# Patient Record
Sex: Male | Born: 1965 | Hispanic: Yes | Marital: Single | Smoking: Former smoker
Health system: Southern US, Community
[De-identification: ages and names within clinical notes are randomized; demographics above are authoritative.]

## PROBLEM LIST (undated history)

## (undated) DIAGNOSIS — Z1211 Encounter for screening for malignant neoplasm of colon: Secondary | ICD-10-CM

## (undated) DIAGNOSIS — T7840XA Allergy, unspecified, initial encounter: Secondary | ICD-10-CM

## (undated) DIAGNOSIS — K219 Gastro-esophageal reflux disease without esophagitis: Secondary | ICD-10-CM

## (undated) HISTORY — DX: Encounter for screening for malignant neoplasm of colon: Z12.11

## (undated) HISTORY — PX: NO PAST SURGERIES: SHX2092

## (undated) HISTORY — DX: Gastro-esophageal reflux disease without esophagitis: K21.9

## (undated) HISTORY — DX: Allergy, unspecified, initial encounter: T78.40XA

---

## 2012-03-04 ENCOUNTER — Ambulatory Visit: Payer: Self-pay | Admitting: Family Medicine

## 2012-03-11 ENCOUNTER — Ambulatory Visit: Payer: Self-pay | Admitting: Unknown Physician Specialty

## 2012-10-17 ENCOUNTER — Ambulatory Visit: Payer: Self-pay | Admitting: Physician Assistant

## 2013-03-28 IMAGING — CT CT ABD-PELV W/ CM
1 of 3 series · 13 of 32 positions shown, 18 images · non-contrast
Comparison: none

REASON FOR EXAM: Epigastric pain Nausea vomiting elevated alkaline
phosphates elevated transa...
COMMENTS:

[Series 2: abd 3mm w 3.0 i40f 3 · axial · 0.73mm/px · z∈[-1132,-724]mm · 13 of 154 slices shown, 18 images]
[im 9/154  soft-tissue]
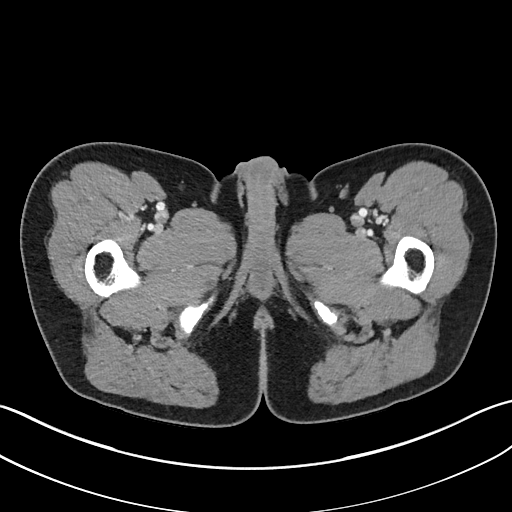
[im 9/154  bone]
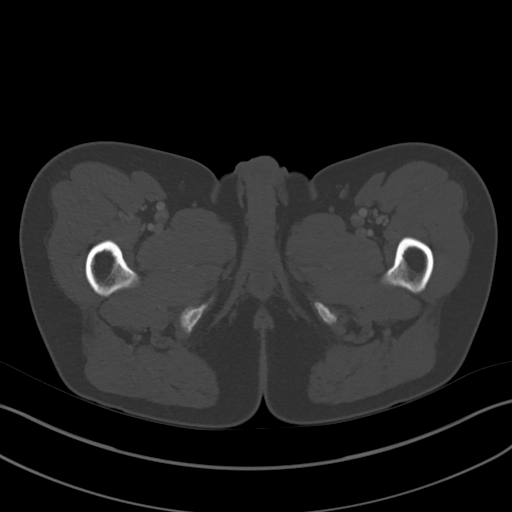
[im 25/154  soft-tissue]
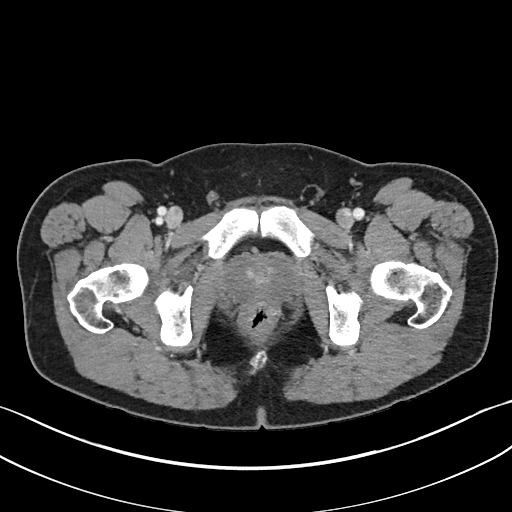
[im 33/154  soft-tissue]
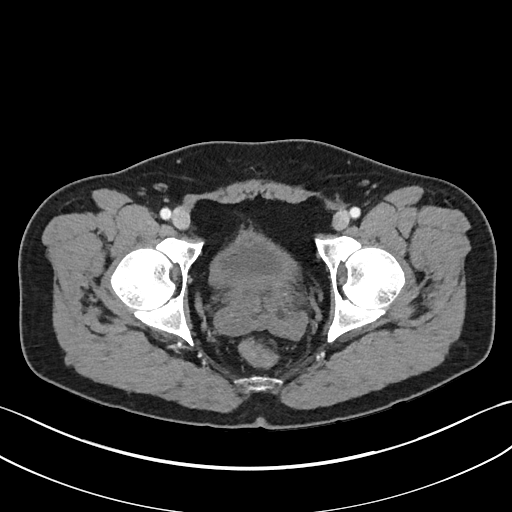
[im 49/154  soft-tissue]
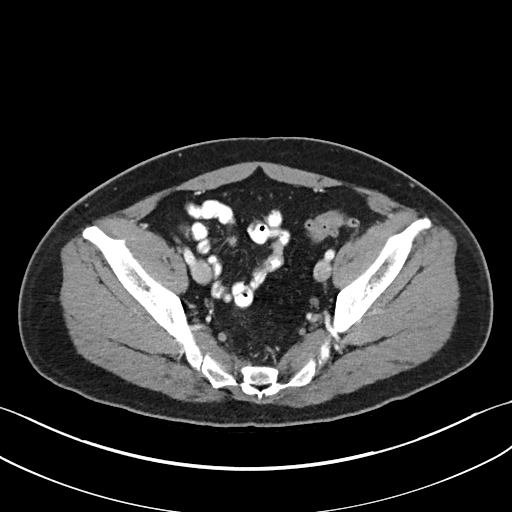
[im 57/154  soft-tissue]
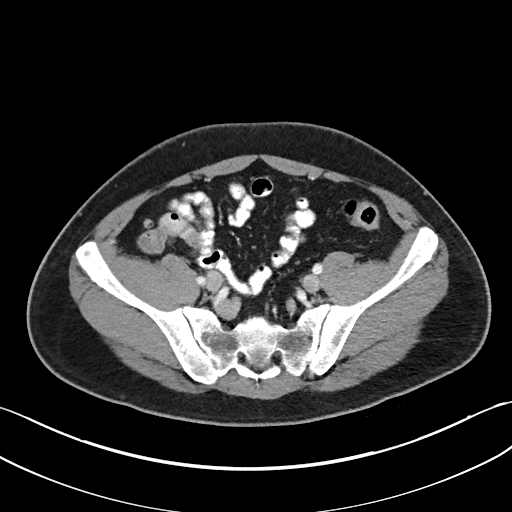
[im 73/154  soft-tissue]
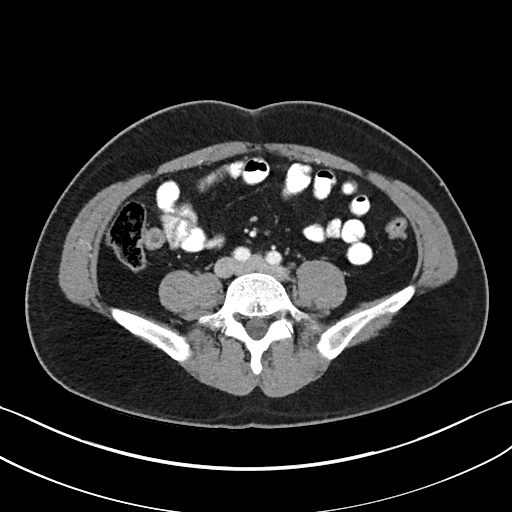
[im 81/154  soft-tissue]
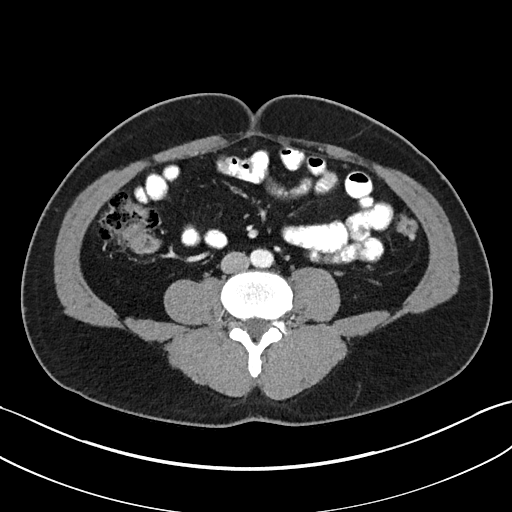
[im 97/154  soft-tissue]
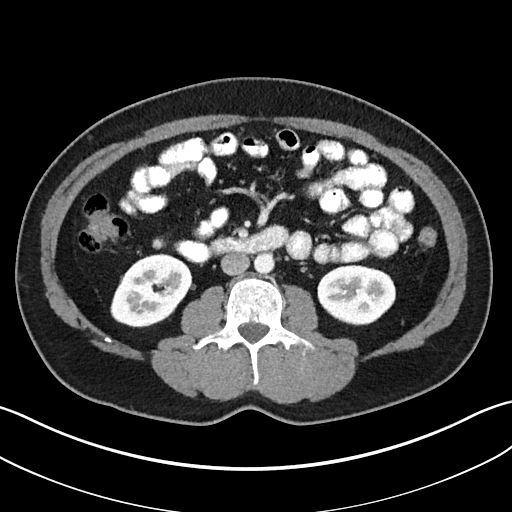
[im 105/154  soft-tissue]
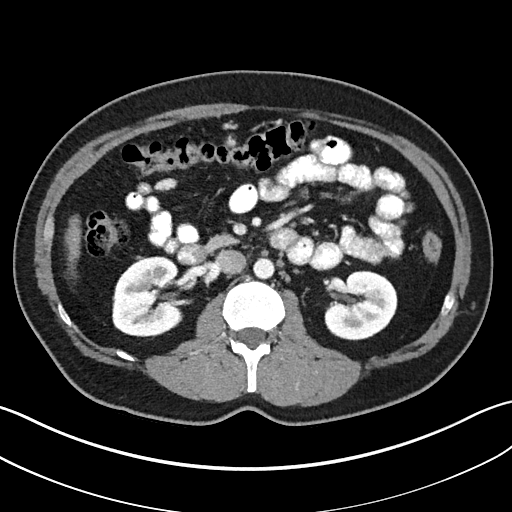
[im 105/154  bone]
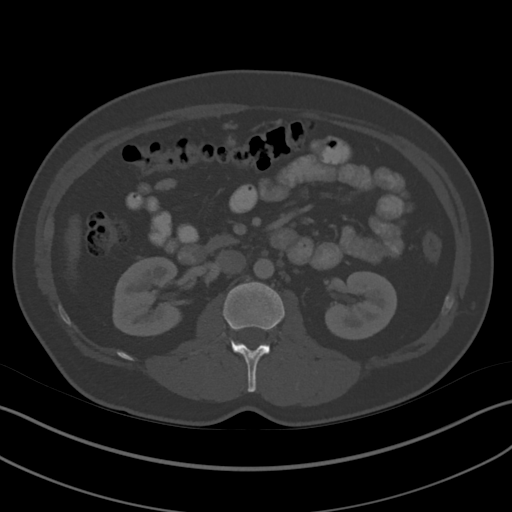
[im 121/154  soft-tissue]
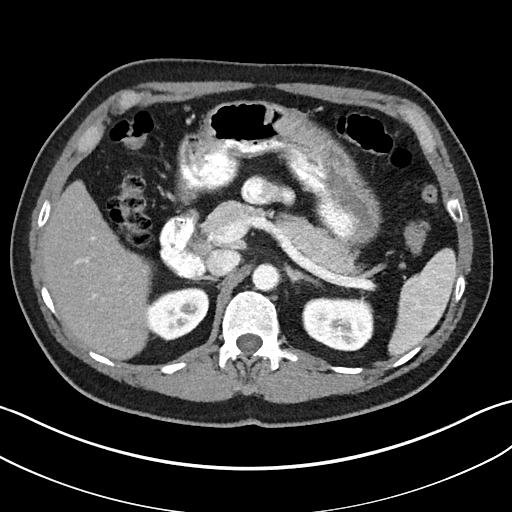
[im 121/154  lung]
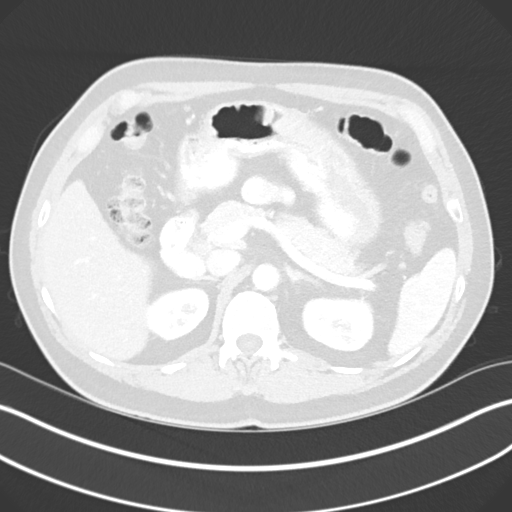
[im 129/154  soft-tissue]
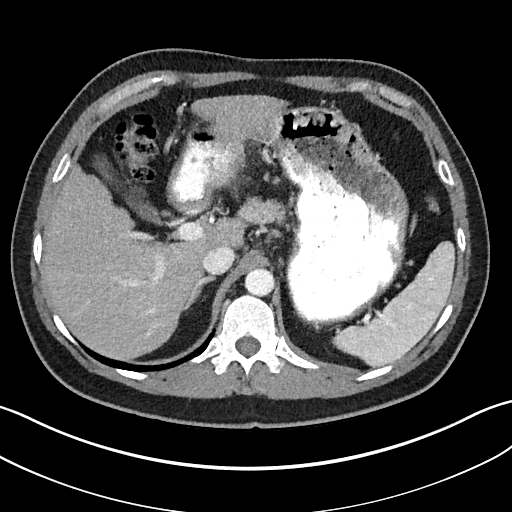
[im 129/154  lung]
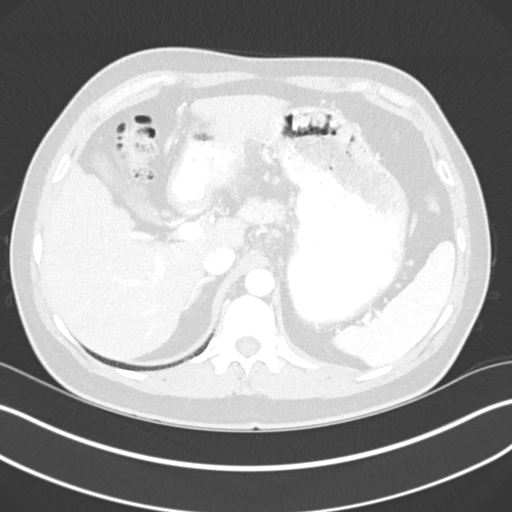
[im 137/154  lung]
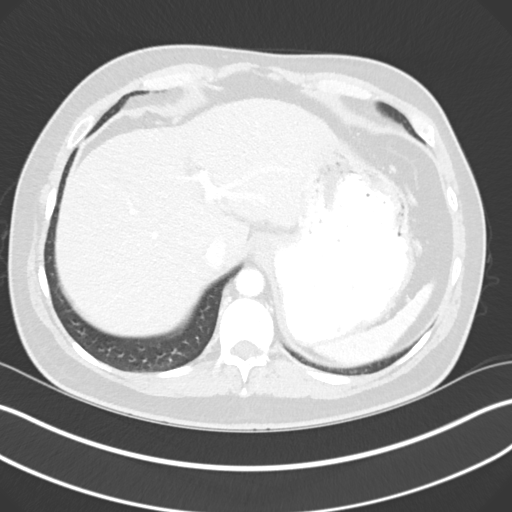
[im 145/154  soft-tissue]
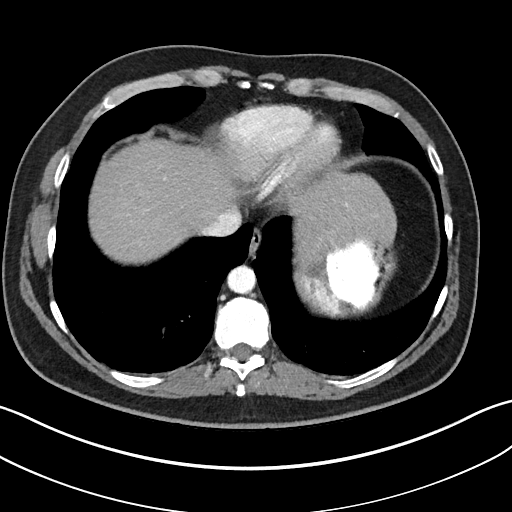
[im 145/154  lung]
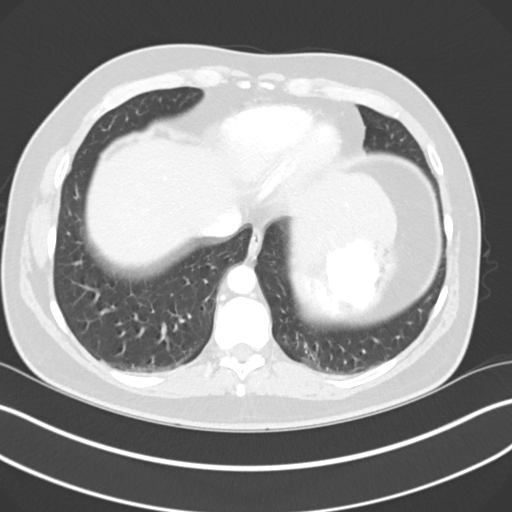

[13 of 32 positions shown; findings below may reference images not displayed]

PROCEDURE:     KCT - KCT ABDOMEN/PELVIS W  - March 11, 2012  [DATE]

RESULT:     Axial CT scanning was performed through the abdomen and pelvis
with reconstructions at 3 mm intervals and slice thicknesses. The patient
received 85 cc of Isovue 370. The patient also received oral contrast
material. Review of multiplanar reconstructed images was performed
separately on the VIA monitor.

The pancreas exhibits normal density with no focal mass or ductal dilation
or inflammatory change. The liver exhibits mildly decreased density
suggesting fatty infiltrative change, but there is no focal mass. There is a
subcentimeter hypodensity in the right lobe near the dome most compatible
with a cyst. There is no intrahepatic ductal dilation. The gallbladder is
adequately distended with no evidence of stones, wall thickening, or
pericholecystic fluid. The spleen is normal in density and size.

There are no adrenal masses. The kidneys enhance well. There is no evidence
of structure nor inflammatory change. The urinary bladder is noncontrast
filled and only partially distended. The prostate gland is enlarged and
produces a prominent impression upon the urinary bladder base. The caliber
of the abdominal aorta is normal. There is no periaortic or pericaval
lymphadenopathy. There is no evidence of ascites. On delayed images contrast
within the renal collecting systems is normal in appearance. There is a
small left inguinal hernia containing only fat.

The stomach is moderately distended with the orally administered contrast.
There is subjective thickening of the wall of the prepyloric region
anteriorly. There does not appear to be obstruction to gastric emptying The
duodenum and jejunum and ileum appear normal. Contrast has not yet reached
the terminal ileum. The terminal ileum appears normal and a normal calibered
partially gas filled appendix is demonstrated. The colon exhibits a normal
stool and gas pattern with no evidence of colitis or of obstruction. There
are scattered diverticula noted.

The lumbar vertebral bodies are preserved in height. The lung bases exhibit
compressive atelectasis.
IMPRESSION: 1. There is no evidence of acute bowel abnormality.
2. There is subjective thickening of the wall of the prepyloric portion of
the stomach. No obstruction to gastric emptying is demonstrated. There is no
perigastric inflammatory change.
3. The duodenum and jejunum and ileum exhibit no acute abnormalities.
4. No gallstones are evident. The liver exhibits no focal mass nor evidence
of ductal dilation. There may be fatty infiltrative change. No acute
pancreatic abnormality is demonstrated.

[REDACTED]

## 2013-11-03 IMAGING — CR RIGHT ELBOW - COMPLETE 3+ VIEW
1 series · 4 of 4 positions shown · non-contrast
Comparison: none

REASON FOR EXAM: injury pain olecraneon process
COMMENTS:

PROCEDURE:     MDR - MDR ELBOW RT COMP W/ OBLIQUES  - October 17, 2012  [DATE]
RESULT:     Right elbow images demonstrate no evidence of fracture,
dislocation or radiopaque foreign body.

[Series 1: lat · 0.17mm/px · 4 of 4 slices shown]
[im 1/4]
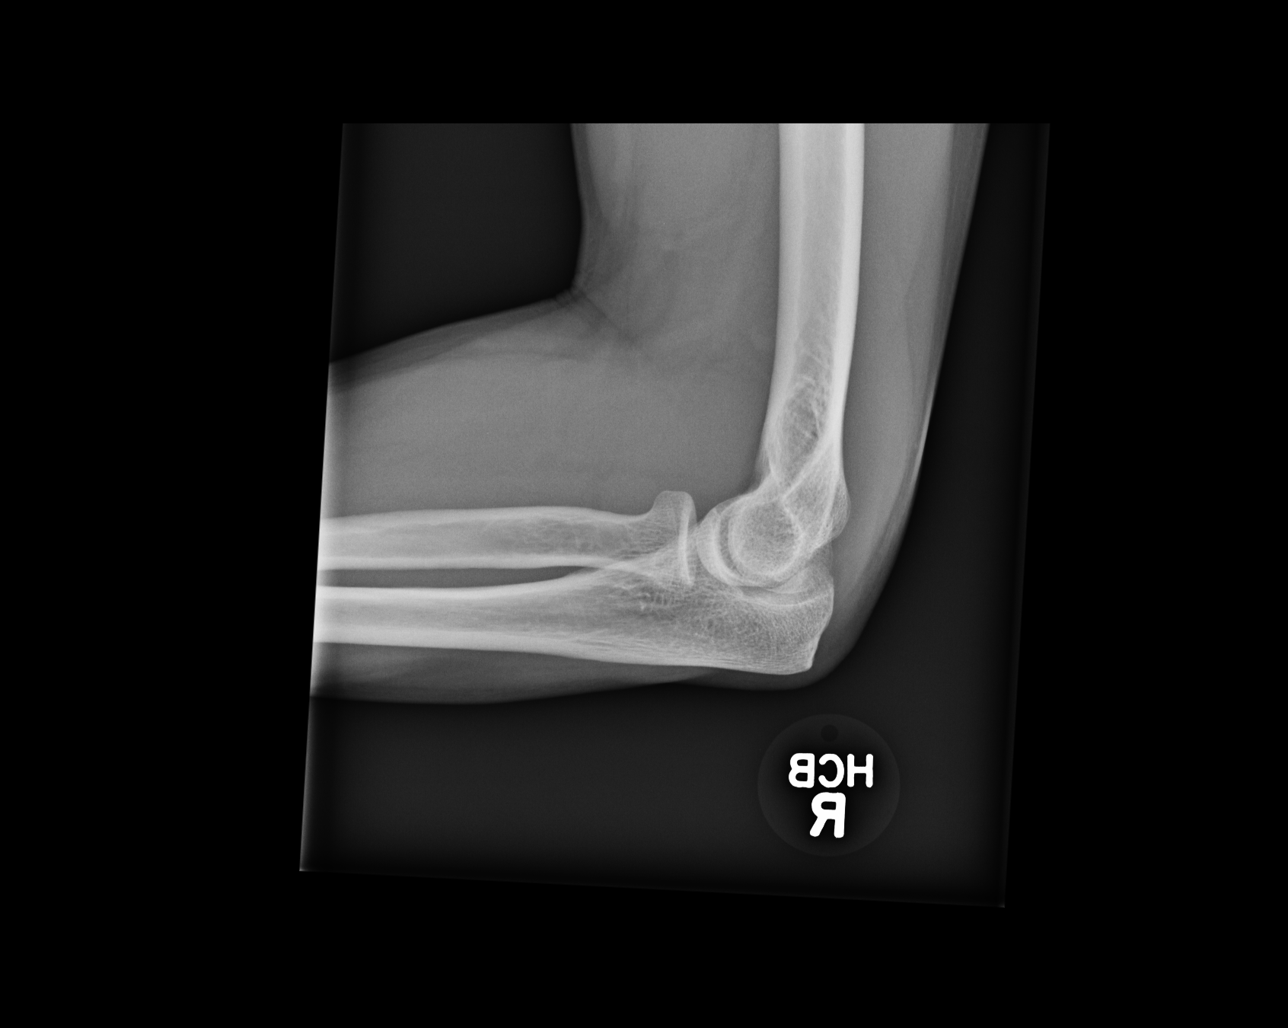
[im 2/4]
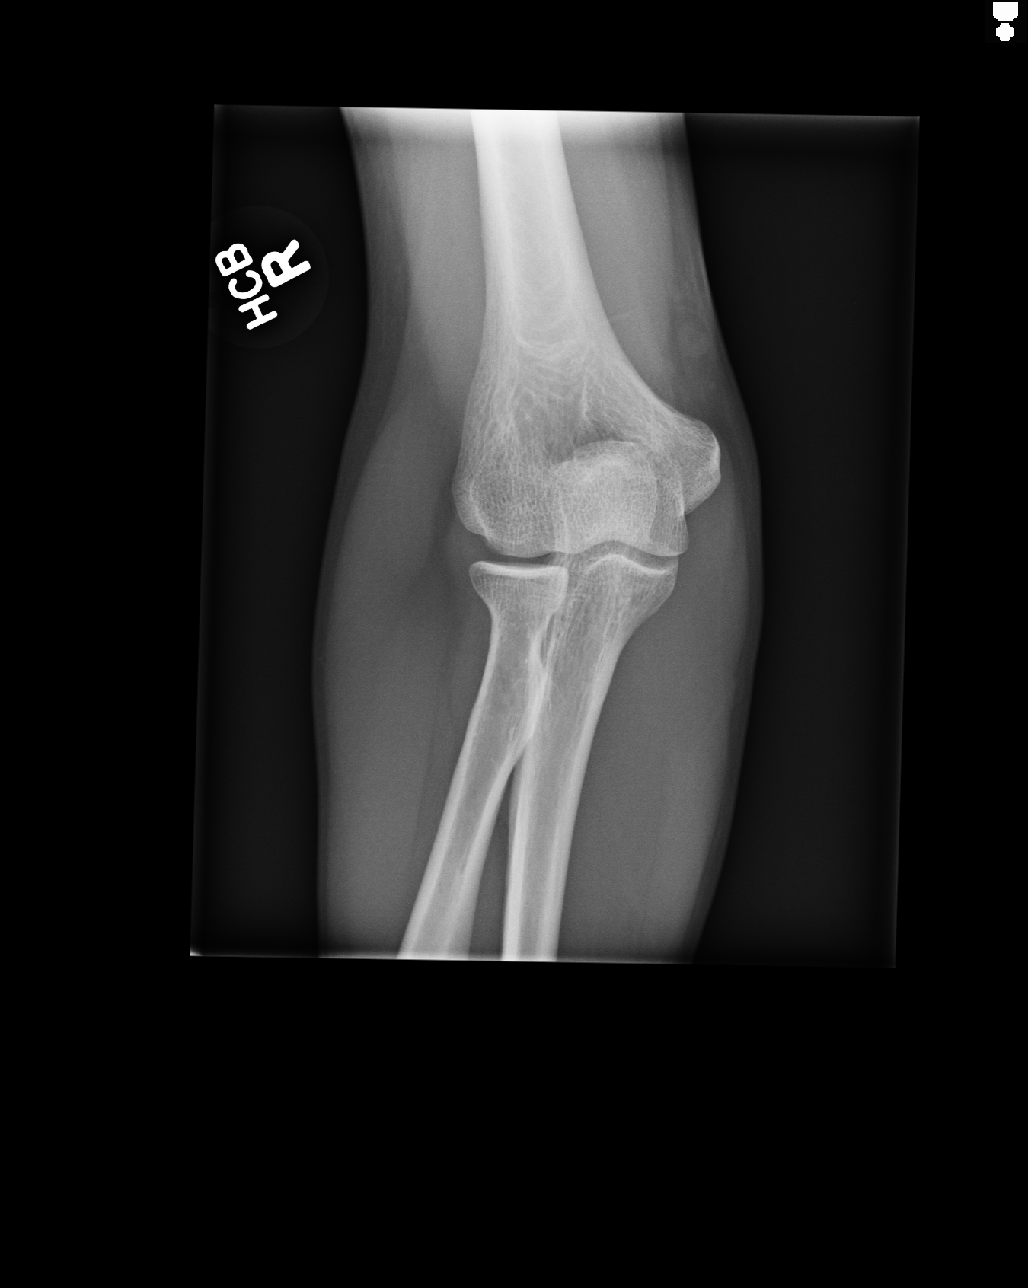
[im 3/4]
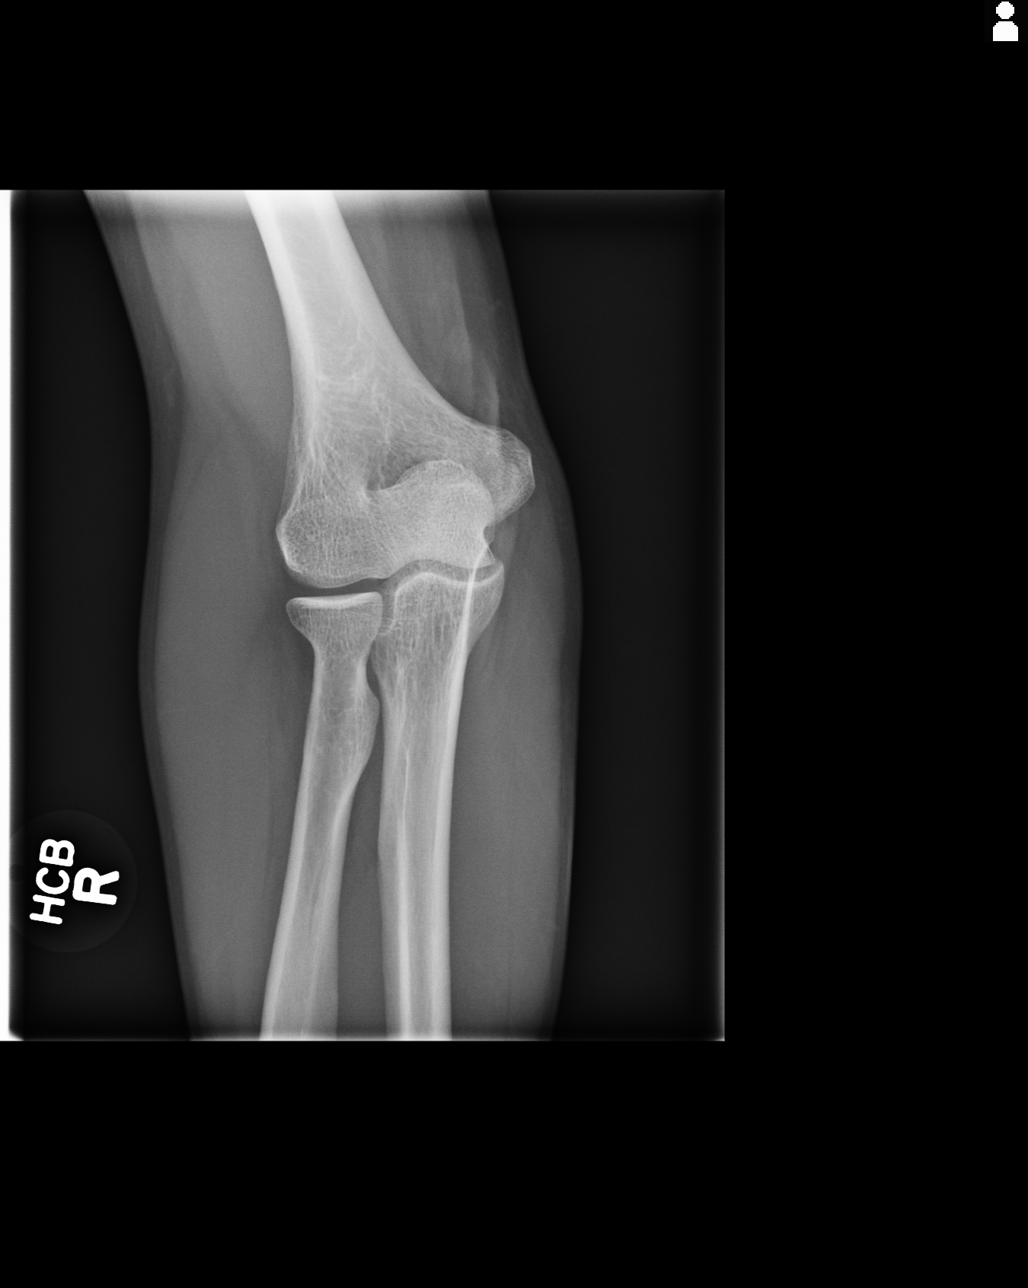
[im 4/4]
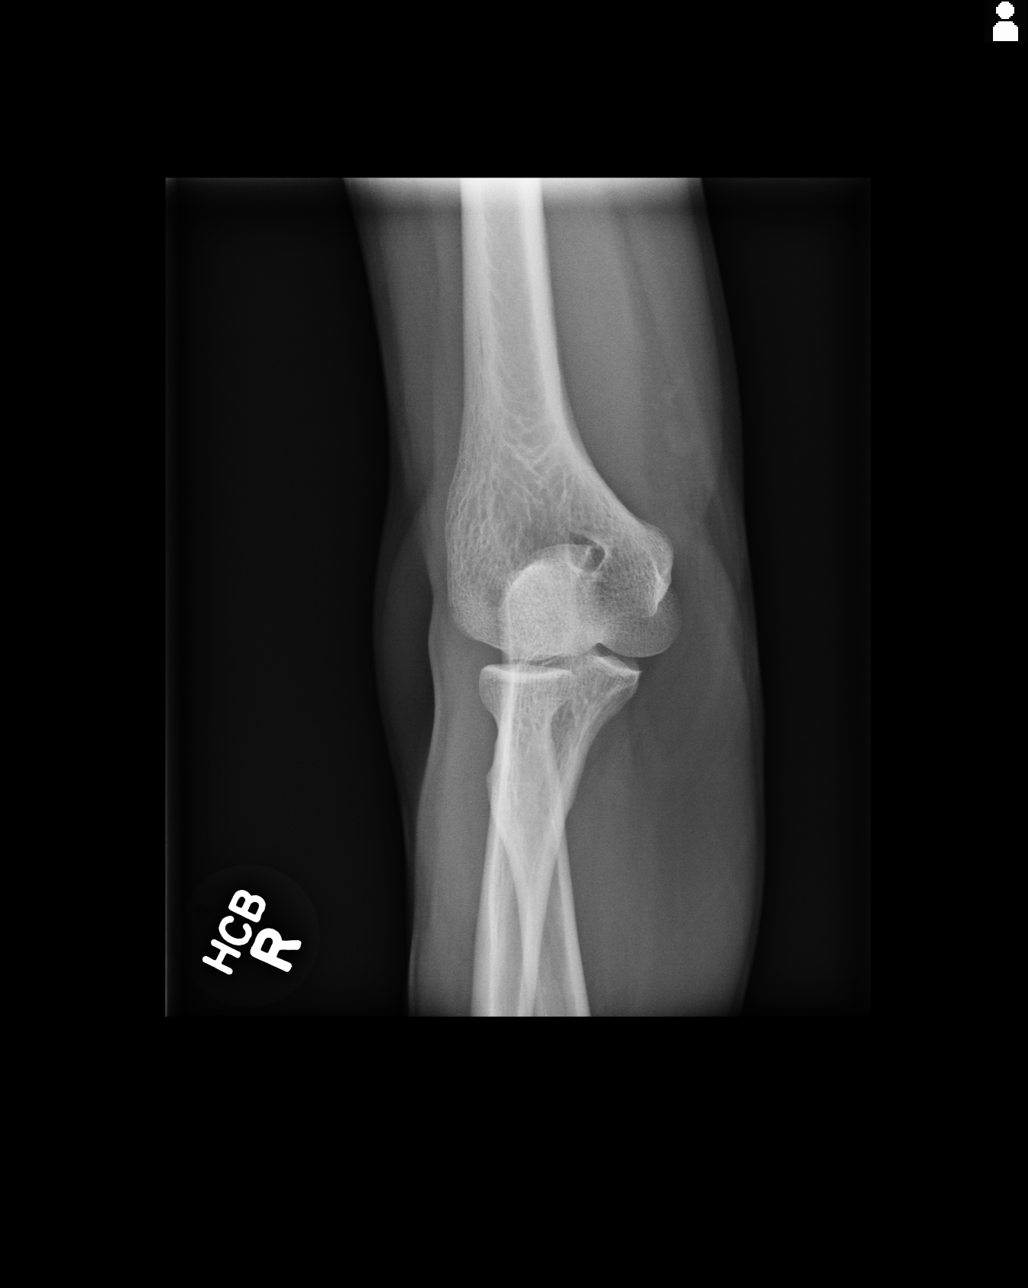

[4 of 4 positions shown; findings below may reference images not displayed]

IMPRESSION: No acute bony abnormality evident in the right elbow.

[REDACTED]

## 2017-05-21 ENCOUNTER — Other Ambulatory Visit: Payer: Self-pay

## 2017-05-25 ENCOUNTER — Ambulatory Visit: Payer: BLUE CROSS/BLUE SHIELD | Admitting: Family Medicine

## 2017-05-25 ENCOUNTER — Encounter: Payer: Self-pay | Admitting: Family Medicine

## 2017-05-25 ENCOUNTER — Other Ambulatory Visit: Payer: Self-pay

## 2017-05-25 VITALS — BP 124/89 | HR 84 | Ht 61.0 in | Wt 151.0 lb

## 2017-05-25 DIAGNOSIS — Z7689 Persons encountering health services in other specified circumstances: Secondary | ICD-10-CM | POA: Diagnosis not present

## 2017-05-25 DIAGNOSIS — K219 Gastro-esophageal reflux disease without esophagitis: Secondary | ICD-10-CM

## 2017-05-25 DIAGNOSIS — R0789 Other chest pain: Secondary | ICD-10-CM

## 2017-05-25 DIAGNOSIS — R945 Abnormal results of liver function studies: Secondary | ICD-10-CM

## 2017-05-25 DIAGNOSIS — L209 Atopic dermatitis, unspecified: Secondary | ICD-10-CM | POA: Insufficient documentation

## 2017-05-25 DIAGNOSIS — R7989 Other specified abnormal findings of blood chemistry: Secondary | ICD-10-CM

## 2017-05-25 MED ORDER — OMEPRAZOLE 20 MG PO CPDR: 20.0000 mg | DELAYED_RELEASE_CAPSULE | Freq: Every day | ORAL | 1 refills | Status: DC

## 2017-05-25 NOTE — Progress Notes (Signed)
Name: Matthew Fletcher   MRN: [id number]    DOB: [birth year]   Date:05/25/2017       Progress Note  Subjective  Chief Complaint  Chief Complaint  Patient presents with  . Establish Care  . Chest Pain    had a workup for heart- everything came back good. Has been told he needs a stress test/ referral to cardiology. Described as a dull ache in chest, "not a killing you pain", tingling in hands    Chest Pain   This is a recurrent problem. The current episode started more than 1 year ago. The onset quality is gradual. The problem occurs intermittently. The problem has been waxing and waning. The pain is present in the lateral region and substernal region (Left pectoral/substernal in morning). The pain is at a severity of 4/10 (duration 1 hour). The quality of the pain is described as pressure. The pain does not radiate. Pertinent negatives include no abdominal pain, back pain, cough, dizziness, fever, headaches, irregular heartbeat, malaise/fatigue, nausea, orthopnea, palpitations, shortness of breath or sputum production.    No problem-specific Assessment & Plan notes found for this encounter.   Past Medical History:  Diagnosis Date  . Allergy   . GERD (gastroesophageal reflux disease)     History reviewed. No pertinent surgical history.  History reviewed. No pertinent family history.  Social History   Socioeconomic History  . Marital status: Single    Spouse name: Not on file  . Number of children: Not on file  . Years of education: Not on file  . Highest education level: Not on file  Occupational History  . Not on file  Social Needs  . Financial resource strain: Not on file  . Food insecurity:    Worry: Not on file    Inability: Not on file  . Transportation needs:    Medical: Not on file    Non-medical: Not on file  Tobacco Use  . Smoking status: Former Research scientist (life sciences)  . Smokeless tobacco: Never Used  Substance and Sexual Activity  . Alcohol use: Yes    Comment: occassionally  .  Drug use: Never  . Sexual activity: Yes  Lifestyle  . Physical activity:    Days per week: Not on file    Minutes per session: Not on file  . Stress: Not on file  Relationships  . Social connections:    Talks on phone: Not on file    Gets together: Not on file    Attends religious service: Not on file    Active member of club or organization: Not on file    Attends meetings of clubs or organizations: Not on file    Relationship status: Not on file  . Intimate partner violence:    Fear of current or ex partner: Not on file    Emotionally abused: Not on file    Physically abused: Not on file    Forced sexual activity: Not on file  Other Topics Concern  . Not on file  Social History Narrative  . Not on file    Allergies  Allergen Reactions  . Tuberculin Tests     Outpatient Medications Prior to Visit  Medication Sig Dispense Refill  . Nutritional Supplements (PROSTATE 2.4) CAPS Take 1 capsule by mouth 2 (two) times daily.    Marland Kitchen omeprazole (PRILOSEC) 20 MG capsule Take 1 capsule by mouth daily.     No facility-administered medications prior to visit.     Review of Systems  Constitutional: Negative  for chills, fever, malaise/fatigue and weight loss.  HENT: Negative for ear discharge, ear pain and sore throat.   Eyes: Negative for blurred vision.  Respiratory: Negative for cough, sputum production, shortness of breath and wheezing.   Cardiovascular: Positive for chest pain. Negative for palpitations, orthopnea and leg swelling.  Gastrointestinal: Negative for abdominal pain, blood in stool, constipation, diarrhea, heartburn, melena and nausea.  Genitourinary: Negative for dysuria, frequency, hematuria and urgency.  Musculoskeletal: Negative for back pain, joint pain, myalgias and neck pain.  Skin: Negative for rash.  Neurological: Negative for dizziness, tingling, sensory change, focal weakness and headaches.  Endo/Heme/Allergies: Negative for environmental allergies and  polydipsia. Does not bruise/bleed easily.  Psychiatric/Behavioral: Negative for depression and suicidal ideas. The patient is not nervous/anxious and does not have insomnia.      Objective  Vitals:   05/25/17 1047  BP: 124/89  Pulse: 84  Weight: 151 lb (68.5 kg)  Height: 5\' 1"  (1.549 m)    Physical Exam  Constitutional: He is oriented to person, place, and time and well-developed, well-nourished, and in no distress.  HENT:  Head: Normocephalic.  Right Ear: External ear normal.  Left Ear: External ear normal.  Nose: Nose normal.  Mouth/Throat: Oropharynx is clear and moist.  Eyes: Pupils are equal, round, and reactive to light. Conjunctivae and EOM are normal. Right eye exhibits no discharge. Left eye exhibits no discharge. No scleral icterus.  Neck: Normal range of motion. Neck supple. No JVD present. No tracheal deviation present. No thyromegaly present.  Cardiovascular: Normal rate, regular rhythm, normal heart sounds and intact distal pulses. Exam reveals no gallop and no friction rub.  No murmur heard. Pulmonary/Chest: Breath sounds normal. No respiratory distress. He has no wheezes. He has no rales.  Abdominal: Soft. Normal aorta and bowel sounds are normal. He exhibits no shifting dullness and no mass. There is no hepatosplenomegaly. There is no tenderness. There is no rebound, no guarding and no CVA tenderness.  Musculoskeletal: Normal range of motion. He exhibits no edema or tenderness.  Lymphadenopathy:    He has no cervical adenopathy.  Neurological: He is alert and oriented to person, place, and time. He has normal sensation, normal strength, normal reflexes and intact cranial nerves. No cranial nerve deficit.  Skin: Skin is warm. No rash noted.  Psychiatric: Mood and affect normal.  Nursing note and vitals reviewed.     Assessment & Plan  Problem List Items Addressed This Visit    None    Visit Diagnoses    Establishing care with new doctor, encounter for     -  Primary   Other chest pain       two 1) substernal pressure in morning and 2) left lateral chest wall pain   Relevant Orders   EKG 12-Lead (Completed)   Ambulatory referral to Cardiology   Elevated liver function tests       Relevant Orders   US Abdomen Limited RUQ   Hepatic Function Panel (6)   Gastroesophageal reflux disease, esophagitis presence not specified       on omeprazole   Relevant Medications   omeprazole (PRILOSEC) 20 MG capsule      No orders of the defined types were placed in this encounter. I spent 50 minutes with this patient, More than 50% of that time was spent in face to face education, counseling and care coordination. Patient was unsure of where he had been seen in the past. We called around to four places trying to  get notes, ekg and labs. We were able to locate and get notes that stated "EKG- wnl, but the actual EKG was not included with notes. Patient also had elevated liver enzymes that we are repeating today.     Dr. [facility]  05/25/17

## 2017-05-26 LAB — HEPATIC FUNCTION PANEL (6)
ALT: 221 IU/L — ABNORMAL HIGH (ref 0–44)
AST: 145 IU/L — ABNORMAL HIGH (ref 0–40)
Albumin: 4.7 g/dL (ref 3.5–5.5)
Alkaline Phosphatase: 139 IU/L — ABNORMAL HIGH (ref 39–117)
Bilirubin Total: 0.5 mg/dL (ref 0.0–1.2)
Bilirubin, Direct: 0.17 mg/dL (ref 0.00–0.40)

## 2017-05-27 ENCOUNTER — Other Ambulatory Visit: Payer: Self-pay

## 2017-05-27 DIAGNOSIS — R748 Abnormal levels of other serum enzymes: Secondary | ICD-10-CM

## 2017-05-31 ENCOUNTER — Ambulatory Visit
Admission: RE | Admit: 2017-05-31 | Discharge: 2017-05-31 | Disposition: A | Discharge status: Home / Self Care | Payer: BLUE CROSS/BLUE SHIELD | Source: Ambulatory Visit | Attending: Family Medicine | Admitting: Family Medicine

## 2017-05-31 DIAGNOSIS — R945 Abnormal results of liver function studies: Secondary | ICD-10-CM | POA: Diagnosis not present

## 2017-05-31 DIAGNOSIS — R7989 Other specified abnormal findings of blood chemistry: Secondary | ICD-10-CM

## 2017-06-03 ENCOUNTER — Other Ambulatory Visit: Payer: Self-pay

## 2017-06-03 DIAGNOSIS — I2089 Other forms of angina pectoris: Secondary | ICD-10-CM | POA: Insufficient documentation

## 2017-06-03 DIAGNOSIS — I208 Other forms of angina pectoris: Secondary | ICD-10-CM | POA: Insufficient documentation

## 2017-06-04 ENCOUNTER — Other Ambulatory Visit
Admission: RE | Admit: 2017-06-04 | Discharge: 2017-06-04 | Disposition: A | Discharge status: Home / Self Care | Payer: BLUE CROSS/BLUE SHIELD | Source: Ambulatory Visit | Attending: Gastroenterology | Admitting: Gastroenterology

## 2017-06-04 ENCOUNTER — Other Ambulatory Visit: Payer: Self-pay

## 2017-06-04 ENCOUNTER — Encounter: Payer: Self-pay | Admitting: Gastroenterology

## 2017-06-04 ENCOUNTER — Ambulatory Visit: Payer: BLUE CROSS/BLUE SHIELD | Admitting: Gastroenterology

## 2017-06-04 VITALS — BP 130/84 | HR 61 | Temp 98.8°F | Ht 61.0 in | Wt 151.0 lb

## 2017-06-04 DIAGNOSIS — Z1211 Encounter for screening for malignant neoplasm of colon: Secondary | ICD-10-CM | POA: Diagnosis not present

## 2017-06-04 DIAGNOSIS — R945 Abnormal results of liver function studies: Secondary | ICD-10-CM | POA: Insufficient documentation

## 2017-06-04 DIAGNOSIS — Z87891 Personal history of nicotine dependence: Secondary | ICD-10-CM | POA: Diagnosis not present

## 2017-06-04 DIAGNOSIS — K219 Gastro-esophageal reflux disease without esophagitis: Secondary | ICD-10-CM | POA: Diagnosis not present

## 2017-06-04 DIAGNOSIS — Z79899 Other long term (current) drug therapy: Secondary | ICD-10-CM | POA: Diagnosis not present

## 2017-06-04 DIAGNOSIS — R7989 Other specified abnormal findings of blood chemistry: Secondary | ICD-10-CM

## 2017-06-04 LAB — BASIC METABOLIC PANEL
Anion gap: 9 (ref 5–15)
BUN: 14 mg/dL (ref 6–20)
CO2: 26 mmol/L (ref 22–32)
Calcium: 9 mg/dL (ref 8.9–10.3)
Chloride: 107 mmol/L (ref 101–111)
Creatinine, Ser: 0.64 mg/dL (ref 0.61–1.24)
GFR calc Af Amer: >60 mL/min (ref >60)
GFR calc non Af Amer: >60 mL/min (ref >60)
Glucose, Bld: 113 mg/dL — ABNORMAL HIGH (ref 65–99)
Potassium: 3.8 mmol/L (ref 3.5–5.1)
Sodium: 142 mmol/L (ref 135–145)

## 2017-06-04 LAB — CBC
HCT: 40.5 % (ref 40.0–52.0)
Hemoglobin: 13.8 g/dL (ref 13.0–18.0)
MCH: 31.4 pg (ref 26.0–34.0)
MCHC: 34 g/dL (ref 32.0–36.0)
MCV: 92.5 fL (ref 80.0–100.0)
Platelets: 196 10*3/uL (ref 150–440)
RBC: 4.39 MIL/uL — ABNORMAL LOW (ref 4.40–5.90)
RDW: 13.7 % (ref 11.5–14.5)
WBC: 5.9 10*3/uL (ref 3.8–10.6)

## 2017-06-04 LAB — PROTIME-INR
INR: 1.08
Prothrombin Time: 13.9 seconds (ref 11.4–15.2)

## 2017-06-04 LAB — TSH: TSH: 1.468 u[IU]/mL (ref 0.350–4.500)

## 2017-06-04 LAB — FERRITIN: Ferritin: 342 ng/mL — ABNORMAL HIGH (ref 24–336)

## 2017-06-04 NOTE — Progress Notes (Signed)
Matthew Darby, MD [address]  [place], [place] [address]  Main: [phone]  Fax: [phone]    Gastroenterology Consultation  Referring Provider:     Juline Patch, MD Primary Care Physician:  Matthew Patch, MD Primary Gastroenterologist:  Dr. Cephas Fletcher Reason for Consultation:     Elevated LFTs        HPI:   Matthew Fletcher is a 52 y.o. male referred by Dr. Juline Patch, MD  for consultation & management of and elevated LFTs. Patient is Spanish-speaking male, from [place] moved to [place] in 1990s. He is incidentally found to have elevated LFTs. His initial abnormal LFTs are documented from 05/18/2017 alkaline phosphatase 124, AST 94, AST 163, T bili 0.6, total protein 7, albumin 4.6 by his primary care provider. He had repeat LFTs which are persistently elevated. Therefore, he is referred to GI for further consultation. Patient tells me that he was found to have abnormal LFTs few years ago when he was seen by [facility] GI. He is not aware of any further workup done at that time. He had an ultrasound which did not reveal evidence of portal hypertension, cirrhosis or portal vein thrombosis. Patient reports frequent liquid Tylenol use from 2010 to 2013 secondary to frequent colds symptoms. When he found that his LFTs were elevated, he stopped taking Tylenol and switched to taking Aleve occasionally. He denies drinking heavily in the past. He does drink occasionally 2 beers per week. He denies IV drug abuse, blood transfusions, was not active in [employer], denies multiple sexual partners, sharing needles He also has chronic heartburn for which he takes omeprazole 20 mg daily before breakfast for the last 4-5 years He otherwise is asymptomatic, denies any other GI or liver related symptoms He takes prostate nutrition supplements, denies any other herbal supplements  NSAIDs: Aleve occasionally  Antiplts/Anticoagulants/Anti thrombotics: none  GI  Procedures: He reports undergoing EGD and a colonoscopy and he was shown 52 years old by [place] [facility] GI. Reports are not available.   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 Allergy   . GERD (gastroesophageal reflux disease)     History reviewed. No pertinent surgical history.   Current Outpatient Medications:  .  Nutritional Supplements (PROSTATE 2.4) CAPS, Take 1 capsule by mouth 2 (two) times daily., Disp: , Rfl:  .  omeprazole (PRILOSEC) 20 MG capsule, Take 1 capsule (20 mg total) by mouth daily., Disp: 30 capsule, Rfl: 1   History reviewed. No pertinent family history.   Social History   Tobacco Use  . Smoking status: Former Research scientist (life sciences)  . Smokeless tobacco: Never Used  Substance Use Topics  . Alcohol use: Yes    Comment: occassionally  . Drug use: Never    Allergies as of 06/04/2017 - Review Complete 06/04/2017  Allergen Reaction Noted  . Tuberculin tests  05/25/2017    Review of Systems:    All systems reviewed and negative except where noted in HPI.   Physical Exam:  BP 130/84   Pulse 61   Temp 98.8 F (37.1 C)   Ht 5\' 1"  (1.549 m)   Wt 151 lb (68.5 kg)   BMI 28.53 kg/m  No LMP for male patient.  General:   Alert,  Well-developed, well-nourished, pleasant and cooperative in NAD Head:  Normocephalic and atraumatic. Eyes:  Sclera clear, no icterus.   Conjunctiva pink. Ears:  Normal auditory acuity. Nose:  No deformity, discharge, or lesions. Mouth:  No deformity or lesions,oropharynx pink & moist. Neck:  Supple; no masses or thyromegaly. Lungs:  Respirations even and unlabored.  Clear throughout to auscultation.   No wheezes, crackles, or rhonchi. No acute distress. Heart:  Regular rate and rhythm; no murmurs, clicks, rubs, or gallops. Abdomen:  Normal bowel sounds. Soft, non-tender and non-distended without  masses, hepatosplenomegaly or hernias noted.  No guarding or rebound tenderness.   Rectal: Not performed Msk:  Symmetrical without gross deformities. Good, equal movement & strength bilaterally. Pulses:  Normal pulses noted. Extremities:  No clubbing or edema.  No cyanosis. Neurologic:  Alert and oriented x3;  grossly normal neurologically. Skin:  Intact without significant lesions or rashes. No jaundice. Lymph Nodes:  No significant cervical adenopathy. Psych:  Alert and cooperative. Normal mood and affect.  Imaging Studies: [place] abd RUQ 05/31/17 IMPRESSION: No abnormality seen in the right upper quadrant of the abdomen.  Assessment and Plan:   Matthew Fletcher is a 52 y.o. [place] male with elevated transaminases of unclear etiology. There is no evidence of chronic liver disease. He does have chronic GERD, currently on omeprazole  Elevated LFTs - Complete secondary liver disease workup, labs ordered - advised him to avoid alcohol use  Chronic GERD Recommend EGD Continue omeprazole  Colon cancer screening Recommend colonoscopy  I have discussed alternative options, risks & benefits,  which include, but are not limited to, bleeding, infection, perforation,respiratory complication & drug reaction.  The patient agrees with this plan & written consent will be obtained.     Follow up in 2 weeks   Matthew Darby, MD

## 2017-06-05 LAB — ANA COMPREHENSIVE PANEL

## 2017-06-05 LAB — HEPATITIS PANEL, ACUTE
HCV Ab: <0.1 s/co ratio (ref 0.0–0.9)
Hep A IgM: NEGATIVE
Hep B C IgM: NEGATIVE
Hepatitis B Surface Ag: NEGATIVE

## 2017-06-05 LAB — CERULOPLASMIN: Ceruloplasmin: 21.4 mg/dL (ref 16.0–31.0)

## 2017-06-05 LAB — HIV ANTIBODY (ROUTINE TESTING W REFLEX): HIV Screen 4th Generation wRfx: NONREACTIVE

## 2017-06-06 LAB — ANTI-SMOOTH MUSCLE ANTIBODY, IGG: F-Actin IgG: 4 Units (ref 0–19)

## 2017-06-06 LAB — MITOCHONDRIAL ANTIBODIES: Mitochondrial M2 Ab, IgG: <20 Units (ref 0.0–20.0)

## 2017-06-07 ENCOUNTER — Other Ambulatory Visit: Payer: Self-pay | Admitting: Gastroenterology

## 2017-06-07 ENCOUNTER — Other Ambulatory Visit: Payer: Self-pay

## 2017-06-07 ENCOUNTER — Encounter: Payer: Self-pay | Admitting: *Deleted

## 2017-06-07 DIAGNOSIS — R7989 Other specified abnormal findings of blood chemistry: Secondary | ICD-10-CM

## 2017-06-07 LAB — ALPHA-1 ANTITRYPSIN PHENOTYPE: A-1 Antitrypsin, Ser: 142 mg/dL (ref 90–200)

## 2017-06-08 ENCOUNTER — Telehealth: Payer: Self-pay

## 2017-06-08 NOTE — Telephone Encounter (Signed)
Patient has been informed that his labs have come back and iron stores are elevate and you will discuss when he comes in for colonoscopy tomorrow.  Thanks  [employer]

## 2017-06-08 NOTE — Telephone Encounter (Signed)
-----   Message from Lin Landsman, MD sent at 06/07/2017  4:00 PM EDT ----- I will talk to him about the results when he comes for the colonoscopy on 06/09/2017  -RV ----- Message ----- From: Buel Ream, Lab In [place] Sent: 06/04/2017  11:54 AM To: Lin Landsman, MD

## 2017-06-09 ENCOUNTER — Ambulatory Visit: Payer: BLUE CROSS/BLUE SHIELD | Admitting: Anesthesiology

## 2017-06-09 ENCOUNTER — Encounter
Admission: RE | Disposition: A | Discharge status: Home / Self Care | Payer: Self-pay | Source: Ambulatory Visit | Attending: Gastroenterology

## 2017-06-09 ENCOUNTER — Other Ambulatory Visit
Admission: RE | Admit: 2017-06-09 | Discharge: 2017-06-09 | Disposition: A | Discharge status: Home / Self Care | Payer: BLUE CROSS/BLUE SHIELD | Source: Ambulatory Visit | Attending: Gastroenterology | Admitting: Gastroenterology

## 2017-06-09 ENCOUNTER — Ambulatory Visit
Admission: RE | Admit: 2017-06-09 | Discharge: 2017-06-09 | Disposition: A | Discharge status: Home / Self Care | Payer: BLUE CROSS/BLUE SHIELD | Source: Ambulatory Visit | Attending: Gastroenterology | Admitting: Gastroenterology

## 2017-06-09 DIAGNOSIS — K219 Gastro-esophageal reflux disease without esophagitis: Secondary | ICD-10-CM

## 2017-06-09 DIAGNOSIS — Z79899 Other long term (current) drug therapy: Secondary | ICD-10-CM | POA: Diagnosis not present

## 2017-06-09 DIAGNOSIS — Z87891 Personal history of nicotine dependence: Secondary | ICD-10-CM | POA: Insufficient documentation

## 2017-06-09 DIAGNOSIS — D123 Benign neoplasm of transverse colon: Secondary | ICD-10-CM | POA: Diagnosis not present

## 2017-06-09 DIAGNOSIS — Q402 Other specified congenital malformations of stomach: Secondary | ICD-10-CM | POA: Diagnosis not present

## 2017-06-09 DIAGNOSIS — R7989 Other specified abnormal findings of blood chemistry: Secondary | ICD-10-CM | POA: Insufficient documentation

## 2017-06-09 DIAGNOSIS — Z1211 Encounter for screening for malignant neoplasm of colon: Secondary | ICD-10-CM

## 2017-06-09 DIAGNOSIS — K2950 Unspecified chronic gastritis without bleeding: Secondary | ICD-10-CM | POA: Diagnosis not present

## 2017-06-09 DIAGNOSIS — K5730 Diverticulosis of large intestine without perforation or abscess without bleeding: Secondary | ICD-10-CM | POA: Insufficient documentation

## 2017-06-09 DIAGNOSIS — K635 Polyp of colon: Secondary | ICD-10-CM | POA: Diagnosis not present

## 2017-06-09 DIAGNOSIS — R12 Heartburn: Secondary | ICD-10-CM | POA: Diagnosis not present

## 2017-06-09 HISTORY — PX: POLYPECTOMY: SHX5525

## 2017-06-09 HISTORY — PX: ESOPHAGOGASTRODUODENOSCOPY (EGD) WITH PROPOFOL: SHX5813

## 2017-06-09 HISTORY — PX: COLONOSCOPY WITH PROPOFOL: SHX5780

## 2017-06-09 SURGERY — COLONOSCOPY WITH PROPOFOL
Anesthesia: General

## 2017-06-09 MED ORDER — SODIUM CHLORIDE 0.9 % IV SOLN
INTRAVENOUS | Status: DC
  Administered 2017-06-09: 11:00:00 via INTRAVENOUS

## 2017-06-09 MED ORDER — OXYCODONE HCL 5 MG/5ML PO SOLN: 5.0000 mg | Freq: Once | ORAL | Status: DC | PRN

## 2017-06-09 MED ORDER — LACTATED RINGERS IV SOLN
10.0000 mL/h | INTRAVENOUS | Status: DC
  Administered 2017-06-09: 10:00:00 via INTRAVENOUS
  Administered 2017-06-09: 10 mL/h via INTRAVENOUS

## 2017-06-09 MED ORDER — GLYCOPYRROLATE 0.2 MG/ML IJ SOLN
INTRAMUSCULAR | Status: DC | PRN
  Administered 2017-06-09: 0.1 mg via INTRAVENOUS

## 2017-06-09 MED ORDER — LIDOCAINE HCL (CARDIAC) 20 MG/ML IV SOLN
INTRAVENOUS | Status: DC | PRN
  Administered 2017-06-09: 20 mg via INTRAVENOUS

## 2017-06-09 MED ORDER — OXYCODONE HCL 5 MG PO TABS: 5.0000 mg | ORAL_TABLET | Freq: Once | ORAL | Status: DC | PRN

## 2017-06-09 MED ORDER — PROPOFOL 10 MG/ML IV BOLUS
INTRAVENOUS | Status: DC | PRN
  Administered 2017-06-09: 80 mg via INTRAVENOUS
  Administered 2017-06-09 (×3): 20 mg via INTRAVENOUS
  Administered 2017-06-09: 50 mg via INTRAVENOUS
  Administered 2017-06-09: 20 mg via INTRAVENOUS
  Administered 2017-06-09: 50 mg via INTRAVENOUS
  Administered 2017-06-09 (×6): 20 mg via INTRAVENOUS
  Administered 2017-06-09: 80 mg via INTRAVENOUS

## 2017-06-09 MED ORDER — STERILE WATER FOR IRRIGATION IR SOLN
Status: DC | PRN
  Administered 2017-06-09: 10:00:00

## 2017-06-09 SURGICAL SUPPLY — 37 items
BALLN DILATOR 10-12 8 (BALLOONS)
BALLN DILATOR 12-15 8 (BALLOONS)
BALLN DILATOR 15-18 8 (BALLOONS)
BALLN DILATOR CRE 0-12 8 (BALLOONS)
BALLN DILATOR ESOPH 8 10 CRE (MISCELLANEOUS) IMPLANT
BALLOON DILATOR 12-15 8 (BALLOONS) IMPLANT
BALLOON DILATOR 15-18 8 (BALLOONS) IMPLANT
BALLOON DILATOR CRE 0-12 8 (BALLOONS) IMPLANT
BLOCK BITE 60FR ADLT L/F GRN (MISCELLANEOUS) ×2 IMPLANT
CANISTER SUCT 1200ML W/VALVE (MISCELLANEOUS) ×2 IMPLANT
CLIP HMST 235XBRD CATH ROT (MISCELLANEOUS) IMPLANT
CLIP RESOLUTION 360 11X235 (MISCELLANEOUS)
ELECT REM PT RETURN 9FT ADLT (ELECTROSURGICAL)
ELECTRODE REM PT RTRN 9FT ADLT (ELECTROSURGICAL) IMPLANT
FCP ESCP3.2XJMB 240X2.8X (MISCELLANEOUS)
FORCEPS BIOP RAD 4 LRG CAP 4 (CUTTING FORCEPS) ×2 IMPLANT
FORCEPS BIOP RJ4 240 W/NDL (MISCELLANEOUS)
FORCEPS ESCP3.2XJMB 240X2.8X (MISCELLANEOUS) IMPLANT
GOWN CVR UNV OPN BCK APRN NK (MISCELLANEOUS) ×2 IMPLANT
GOWN ISOL THUMB LOOP REG UNIV (MISCELLANEOUS) ×2
INJECTOR VARIJECT VIN23 (MISCELLANEOUS) IMPLANT
KIT DEFENDO VALVE AND CONN (KITS) IMPLANT
KIT ENDO PROCEDURE OLY (KITS) ×2 IMPLANT
MARKER SPOT ENDO TATTOO 5ML (MISCELLANEOUS) IMPLANT
PROBE APC STR FIRE (PROBE) IMPLANT
RETRIEVER NET PLAT FOOD (MISCELLANEOUS) IMPLANT
RETRIEVER NET ROTH 2.5X230 LF (MISCELLANEOUS) IMPLANT
SNARE COLD EXACTO (MISCELLANEOUS) IMPLANT
SNARE SHORT THROW 13M SML OVAL (MISCELLANEOUS) IMPLANT
SNARE SHORT THROW 30M LRG OVAL (MISCELLANEOUS) IMPLANT
SNARE SNG USE RND 15MM (INSTRUMENTS) IMPLANT
SPOT EX ENDOSCOPIC TATTOO (MISCELLANEOUS)
SYR INFLATION 60ML (SYRINGE) IMPLANT
TRAP ETRAP POLY (MISCELLANEOUS) IMPLANT
VARIJECT INJECTOR VIN23 (MISCELLANEOUS)
WATER STERILE IRR 250ML POUR (IV SOLUTION) ×2 IMPLANT
WIRE CRE 18-20MM 8CM F G (MISCELLANEOUS) IMPLANT

## 2017-06-09 NOTE — Op Note (Signed)
[facility] Gastroenterology Patient Name: Matthew Fletcher Procedure Date: 06/09/2017 10:00 AM MRN: [id number] Account #: [id number] Date of Birth: Apr 25, 1965 Admit Type: Outpatient Age: 52 Room: [facility] OR ROOM 01 Gender: Male Note Status: Finalized Procedure:            Colonoscopy Indications:          Screening for colorectal malignant neoplasm, This is                        the patient's first colonoscopy Providers:            Lin Landsman MD, MD Medicines:            Monitored Anesthesia Care Complications:        No immediate complications. Estimated blood loss: None. Procedure:            Pre-Anesthesia Assessment:                       - Prior to the procedure, a History and Physical was                        performed, and patient medications and allergies were                        reviewed. The patient is competent. The risks and                        benefits of the procedure and the sedation options and                        risks were discussed with the patient. All questions                        were answered and informed consent was obtained.                        Patient identification and proposed procedure were                        verified by the physician, the nurse, the                        anesthesiologist, the anesthetist and the technician in                        the pre-procedure area in the procedure room in the                        endoscopy suite. Mental Status Examination: alert and                        oriented. Airway Examination: normal oropharyngeal                        airway and neck mobility. Respiratory Examination:                        clear to auscultation. CV Examination: normal.  Prophylactic Antibiotics: The patient does not require                        prophylactic antibiotics. Prior Anticoagulants: The                        patient has taken no previous anticoagulant or                         antiplatelet agents. ASA Grade Assessment: II - A                        patient with mild systemic disease. After reviewing the                        risks and benefits, the patient was deemed in                        satisfactory condition to undergo the procedure. The                        anesthesia plan was to use monitored anesthesia care                        (MAC). Immediately prior to administration of                        medications, the patient was re-assessed for adequacy                        to receive sedatives. The heart rate, respiratory rate,                        oxygen saturations, blood pressure, adequacy of                        pulmonary ventilation, and response to care were                        monitored throughout the procedure. The physical status                        of the patient was re-assessed after the procedure.                       After obtaining informed consent, the colonoscope was                        passed under direct vision. Throughout the procedure,                        the patient's blood pressure, pulse, and oxygen                        saturations were monitored continuously. The Olympus                        CF-HQ190L Colonoscope (S#. [id number]) was introduced  through the anus and advanced to the the terminal                        ileum. The colonoscopy was performed without                        difficulty. The patient tolerated the procedure well.                        The quality of the bowel preparation was evaluated                        using the BBPS [employer] [facility] Bowel Preparation Scale) with                        scores of: Right Colon = 3, Transverse Colon = 3 and                        Left Colon = 3 (entire mucosa seen well with no                        residual staining, small fragments of stool or opaque                        liquid). The total BBPS score equals  9. Findings:      The perianal and digital rectal examinations were normal. Pertinent       negatives include normal sphincter tone and no palpable rectal lesions.      The terminal ileum appeared normal.      A 5 mm polyp was found in the transverse colon. The polyp was sessile.       The polyp was removed with a cold snare. Resection and retrieval were       complete.      A single small-mouthed diverticulum was found in the ascending colon.      The exam was otherwise without abnormality.      The retroflexed view of the distal rectum and anal verge was normal and       showed no anal or rectal abnormalities. Impression:           - The examined portion of the ileum was normal.                       - One 5 mm polyp in the transverse colon, removed with                        a cold snare. Resected and retrieved.                       - Diverticulosis in the ascending colon.                       - The examination was otherwise normal.                       - The distal rectum and anal verge are normal on                        retroflexion view. Recommendation:       -  Discharge patient to home.                       - Resume previous diet today.                       - Continue present medications.                       - Await pathology results.                       - Repeat colonoscopy in 5-10 years for surveillance                        based on pathology results.                       - Return to my office as previously scheduled. Procedure Code(s):    --- Professional ---                       [id number], Colonoscopy, flexible; with removal of tumor(s),                        polyp(s), or other lesion(s) by snare technique Diagnosis Code(s):    --- Professional ---                       Z12.11, Encounter for screening for malignant neoplasm                        of colon                       D12.3, Benign neoplasm of transverse colon (hepatic                        flexure  or splenic flexure)                       K57.30, Diverticulosis of large intestine without                        perforation or abscess without bleeding CPT copyright 2017 American Medical Association. All rights reserved. The codes documented in this report are preliminary and upon coder review may  be revised to meet current compliance requirements. Dr. Ulyess Mort Lin Landsman MD, MD 06/09/2017 10:36:52 AM This report has been signed electronically. Number of Addenda: 0 Note Initiated On: 06/09/2017 10:00 AM Scope Withdrawal Time: 0 hours 6 minutes 18 seconds  Total Procedure Duration: 0 hours 9 minutes 44 seconds       [facility]

## 2017-06-09 NOTE — Anesthesia Postprocedure Evaluation (Signed)
Anesthesia Post Note  Patient: Matthew Fletcher  Procedure(s) Performed: COLONOSCOPY WITH PROPOFOL (N/A ) ESOPHAGOGASTRODUODENOSCOPY (EGD) WITH PROPOFOL (N/A ) POLYPECTOMY (N/A )  Patient location during evaluation: PACU Anesthesia Type: General Level of consciousness: awake and alert Pain management: pain level controlled Vital Signs Assessment: post-procedure vital signs reviewed and stable Respiratory status: spontaneous breathing, nonlabored ventilation, respiratory function stable and patient connected to nasal cannula oxygen Cardiovascular status: blood pressure returned to baseline and stable Postop Assessment: no apparent nausea or vomiting Anesthetic complications: no    Nirali Magouirk

## 2017-06-09 NOTE — Transfer of Care (Signed)
Immediate Anesthesia Transfer of Care Note  Patient: Matthew Fletcher  Procedure(s) Performed: COLONOSCOPY WITH PROPOFOL (N/A ) ESOPHAGOGASTRODUODENOSCOPY (EGD) WITH PROPOFOL (N/A ) POLYPECTOMY (N/A )  Patient Location: PACU  Anesthesia Type: General  Level of Consciousness: awake, alert  and patient cooperative  Airway and Oxygen Therapy: Patient Spontanous Breathing and Patient connected to supplemental oxygen  Post-op Assessment: Post-op Vital signs reviewed, Patient's Cardiovascular Status Stable, Respiratory Function Stable, Patent Airway and No signs of Nausea or vomiting  Post-op Vital Signs: Reviewed and stable  Complications: No apparent anesthesia complications

## 2017-06-09 NOTE — Op Note (Signed)
[facility] Patient Name: Matthew Fletcher Procedure Date: 06/09/2017 10:00 AM MRN: [id number] Account #: [id number] Date of Birth: 1965-10-06 Admit Type: Outpatient Age: 52 Room: [facility] OR ROOM 01 Gender: Male Note Status: Finalized Procedure:            Upper GI endoscopy Indications:          Heartburn Providers:            Lin Landsman MD, MD Referring MD:         Juline Patch, MD (Referring MD) Medicines:            Monitored Anesthesia Care Complications:        No immediate complications. Estimated blood loss: None. Procedure:            Pre-Anesthesia Assessment:                       - Prior to the procedure, a History and Physical was                        performed, and patient medications and allergies were                        reviewed. The patient is competent. The risks and                        benefits of the procedure and the sedation options and                        risks were discussed with the patient. All questions                        were answered and informed consent was obtained.                        Patient identification and proposed procedure were                        verified by the physician, the nurse, the                        anesthesiologist, the anesthetist and the technician in                        the pre-procedure area in the procedure room in the                        endoscopy suite. Mental Status Examination: alert and                        oriented. Airway Examination: normal oropharyngeal                        airway and neck mobility. Respiratory Examination:                        clear to auscultation. CV Examination: normal.                        Prophylactic Antibiotics: The patient does not require  prophylactic antibiotics. Prior Anticoagulants: The                        patient has taken no previous anticoagulant or                        antiplatelet  agents. ASA Grade Assessment: II - A                        patient with mild systemic disease. After reviewing the                        risks and benefits, the patient was deemed in                        satisfactory condition to undergo the procedure. The                        anesthesia plan was to use monitored anesthesia care                        (MAC). Immediately prior to administration of                        medications, the patient was re-assessed for adequacy                        to receive sedatives. The heart rate, respiratory rate,                        oxygen saturations, blood pressure, adequacy of                        pulmonary ventilation, and response to care were                        monitored throughout the procedure. The physical status                        of the patient was re-assessed after the procedure.                       After obtaining informed consent, the endoscope was                        passed under direct vision. Throughout the procedure,                        the patient's blood pressure, pulse, and oxygen                        saturations were monitored continuously. The Olympus                        GIF-HQ190 Endoscope (S#. [id number]) was introduced                        through the mouth, and advanced to the second part of  duodenum. The upper GI endoscopy was accomplished                        without difficulty. The patient tolerated the procedure                        well. Findings:      The duodenal bulb and second portion of the duodenum were normal.      The entire examined stomach was normal. Biopsies were taken with a cold       forceps for Helicobacter pylori testing.      The cardia and gastric fundus were normal on retroflexion.      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      The gastroesophageal junction and examined esophagus were  normal. Impression:           - Normal duodenal bulb and second portion of the                        duodenum.                       - Normal stomach. Biopsied.                       - Ectopic gastric mucosa in the upper third of the                        esophagus.                       - Normal gastroesophageal junction and esophagus. Recommendation:       - Await pathology results. Procedure Code(s):    --- Professional ---                       [phone], Esophagogastroduodenoscopy, flexible, transoral;                        with biopsy, single or multiple Diagnosis Code(s):    --- Professional ---                       Q40.2, Other specified congenital malformations of                        stomach                       R12, Heartburn CPT copyright 2017 American Medical Association. All rights reserved. The codes documented in this report are preliminary and upon coder review may  be revised to meet current compliance requirements. Dr. Ulyess Mort Lin Landsman MD, MD 06/09/2017 10:14:35 AM This report has been signed electronically. Number of Addenda: 0 Note Initiated On: 06/09/2017 10:00 AM Total Procedure Duration: 0 hours 5 minutes 42 seconds       [facility]

## 2017-06-09 NOTE — Anesthesia Preprocedure Evaluation (Addendum)
Anesthesia Evaluation  Patient identified by MRN, date of birth, ID band  Reviewed: NPO status   History of Anesthesia Complications Negative for: history of anesthetic complications  Airway Mallampati: II  TM Distance: >3 FB Neck ROM: full    Dental  (+) Partial Upper   Pulmonary neg pulmonary ROS, former smoker,    Pulmonary exam normal        Cardiovascular Exercise Tolerance: Good + angina (atypical, non cardiac) negative cardio ROS Normal cardiovascular exam  ekg: nsr;  cards stable: 05/2017: dr. Nehemiah Massed; normal stress test without evidence of myocardial ischemia.;     Neuro/Psych negative neurological ROS  negative psych ROS   GI/Hepatic GERD  Controlled,Elevated LFTs   Endo/Other  negative endocrine ROS  Renal/GU negative Renal ROS  negative genitourinary   Musculoskeletal   Abdominal   Peds  Hematology negative hematology ROS (+)   Anesthesia Other Findings   Reproductive/Obstetrics                            Anesthesia Physical Anesthesia Plan  ASA: II  Anesthesia Plan: General   Post-op Pain Management:    Induction:   PONV Risk Score and Plan:   Airway Management Planned:   Additional Equipment:   Intra-op Plan:   Post-operative Plan:   Informed Consent: I have reviewed the patients History and Physical, chart, labs and discussed the procedure including the risks, benefits and alternatives for the proposed anesthesia with the patient or authorized representative who has indicated his/her understanding and acceptance.     Plan Discussed with: CRNA  Anesthesia Plan Comments:         Anesthesia Quick Evaluation

## 2017-06-09 NOTE — H&P (Signed)
Cephas Darby, MD [address]  [place]  [place], [place] [address]  Main: [phone]  Fax: [phone] Pager: [phone]  Primary Care Physician:  Juline Patch, MD Primary Gastroenterologist:  Dr. Cephas Darby  Pre-Procedure History & Physical: HPI:  Matthew Fletcher is a 52 y.o. male is here for an endoscopy and colonoscopy.   Past Medical History:  Diagnosis Date  . Allergy   . GERD (gastroesophageal reflux disease)     Past Surgical History:  Procedure Laterality Date  . NO PAST SURGERIES      Prior to Admission medications   Medication Sig Start Date End Date Taking? Authorizing Provider  Nutritional Supplements (PROSTATE 2.4) CAPS Take 1 capsule by mouth 2 (two) times daily.   Yes [provider]  omeprazole (PRILOSEC) 20 MG capsule Take 1 capsule (20 mg total) by mouth daily. 05/25/17  Yes Juline Patch, MD    Allergies as of 06/04/2017 - Review Complete 06/04/2017  Allergen Reaction Noted  . Tuberculin tests  05/25/2017    History reviewed. No pertinent family history.  Social History   Socioeconomic History  . Marital status: Single    Spouse name: Not on file  . Number of children: Not on file  . Years of education: Not on file  . Highest education level: Not on file  Occupational History  . Not on file  Social Needs  . Financial resource strain: Not on file  . Food insecurity:    Worry: Not on file    Inability: Not on file  . Transportation needs:    Medical: Not on file    Non-medical: Not on file  Tobacco Use  . Smoking status: Former Smoker    Last attempt to quit: 1999    Years since quitting: 20.2  . Smokeless tobacco: Never Used  Substance and Sexual Activity  . Alcohol use: Yes    Comment: occassionally - couple times per year  . Drug use: Never  . Sexual activity: Yes  Lifestyle  . Physical activity:    Days per week: Not on file    Minutes per session: Not on file  . Stress: Not on file  Relationships    . Social connections:    Talks on phone: Not on file    Gets together: Not on file    Attends religious service: Not on file    Active member of club or organization: Not on file    Attends meetings of clubs or organizations: Not on file    Relationship status: Not on file  . Intimate partner violence:    Fear of current or ex partner: Not on file    Emotionally abused: Not on file    Physically abused: Not on file    Forced sexual activity: Not on file  Other Topics Concern  . Not on file  Social History Narrative  . Not on file    Review of Systems: See HPI, otherwise negative ROS  Physical Exam: BP 130/71   Pulse 62   Temp 98.1 F (36.7 C) (Temporal)   Resp 16   Ht 5\' 1"  (1.549 m)   Wt 144 lb (65.3 kg)   SpO2 98%   BMI 27.21 kg/m  General:   Alert,  pleasant and cooperative in NAD Head:  Normocephalic and atraumatic. Neck:  Supple; no masses or thyromegaly. Lungs:  Clear throughout to auscultation.    Heart:  Regular rate and rhythm. Abdomen:  Soft, nontender and  nondistended. Normal bowel sounds, without guarding, and without rebound.   Neurologic:  Alert and  oriented x4;  grossly normal neurologically.  Impression/Plan: Matthew Fletcher is here for an endoscopy and colonoscopy to be performed for Chronic GERD and colon cancer screening  Risks, benefits, limitations, and alternatives regarding  endoscopy and colonoscopy have been reviewed with the patient.  Questions have been answered.  All parties agreeable.   Sherri Sear, MD  06/09/2017, 9:24 AM

## 2017-06-09 NOTE — Anesthesia Procedure Notes (Signed)
Procedure Name: MAC Performed by: Lind Guest, CRNA Pre-anesthesia Checklist: Emergency Drugs available, Suction available, Patient identified, Patient being monitored and Timeout performed Patient Re-evaluated:Patient Re-evaluated prior to induction Oxygen Delivery Method: Nasal cannula

## 2017-06-10 ENCOUNTER — Encounter: Payer: Self-pay | Admitting: Gastroenterology

## 2017-06-11 ENCOUNTER — Encounter: Payer: Self-pay | Admitting: Gastroenterology

## 2017-06-14 LAB — HEMOCHROMATOSIS DNA-PCR(C282Y,H63D)

## 2017-06-16 ENCOUNTER — Telehealth: Payer: Self-pay

## 2017-06-16 DIAGNOSIS — R7989 Other specified abnormal findings of blood chemistry: Secondary | ICD-10-CM

## 2017-06-16 DIAGNOSIS — R945 Abnormal results of liver function studies: Principal | ICD-10-CM

## 2017-06-16 NOTE — Telephone Encounter (Signed)
-----   Message from Lin Landsman, MD sent at 06/14/2017  5:05 PM EDT ----- Sharyn Lull  Please inform patient that he does not have mutation for hereditary hemachromatosis. I recommend him to stop the nutrition supplements (prostate 2.4) that he is on, recheck LFTs. Please order them  Thanks RV

## 2017-06-16 NOTE — Telephone Encounter (Signed)
Patient has been asked to recheck LFT. Stop taking nutrition supplement prostate 2.4 and that he does not have the mutation for hereditary hemachromatosis.  Thanks [employer]

## 2017-06-21 ENCOUNTER — Ambulatory Visit: Payer: BLUE CROSS/BLUE SHIELD | Admitting: Gastroenterology

## 2017-06-21 ENCOUNTER — Encounter: Payer: Self-pay | Admitting: Gastroenterology

## 2017-06-21 VITALS — BP 133/81 | HR 65 | Ht 61.0 in | Wt 150.8 lb

## 2017-06-21 DIAGNOSIS — R945 Abnormal results of liver function studies: Secondary | ICD-10-CM

## 2017-06-21 DIAGNOSIS — R7989 Other specified abnormal findings of blood chemistry: Secondary | ICD-10-CM

## 2017-06-21 NOTE — Progress Notes (Signed)
Cephas Darby, MD [address]  [place], [place] [address]  Main: [phone]  Fax: [phone]    Gastroenterology Consultation  Referring Provider:     Juline Patch, MD Primary Care Physician:  Juline Patch, MD Primary Gastroenterologist:  Dr. Cephas Darby Reason for Consultation:     Elevated LFTs        HPI:   Matthew Fletcher is a 52 y.o. male referred by Dr. Juline Patch, MD  for consultation & management of and elevated LFTs. Patient is Spanish-speaking male, from [place] moved to [place] in 1990s. He is incidentally found to have elevated LFTs. His initial abnormal LFTs are documented from 05/18/2017 alkaline phosphatase 124, AST 94, AST 163, T bili 0.6, total protein 7, albumin 4.6 by his primary care provider. He had repeat LFTs which are persistently elevated. Therefore, he is referred to GI for further consultation. Patient tells me that he was found to have abnormal LFTs few years ago when he was seen by [facility] clinic GI. He is not aware of any further workup done at that time. He had an ultrasound which did not reveal evidence of portal hypertension, cirrhosis or portal vein thrombosis. Patient reports frequent liquid Tylenol use from 2010 to 2013 secondary to frequent colds symptoms. When he found that his LFTs were elevated, he stopped taking Tylenol and switched to taking Aleve occasionally. He denies drinking heavily in the past. He does drink occasionally 2 beers per week. He denies IV drug abuse, blood transfusions, was not active in [employer], denies multiple sexual partners, sharing needles He also has chronic heartburn for which he takes omeprazole 20 mg daily before breakfast for the last 4-5 years He otherwise is asymptomatic, denies any other GI or liver related symptoms He takes prostate nutrition supplements, denies any other herbal supplements  Follow-up visit 06/21/2017: Workup for his elevated LFTs revealed mildly elevated  ferritin. Hereditary hemachromatosis panel came back negative. Ultrasound liver was unremarkable. I have asked him to stop taking prostate supplements which she stopped about 2 weeks ago.He underwent EGD and colonoscopy which were essentially normal. He continues to have heaviness in his left chest whenever he is upset or hearing any bad news. He had cardiac workup and that has been negative. He is on omeprazole 20 mg daily in the morning for heartburn. He wants to know if he could take it long-term or switch to another medication.   NSAIDs: Aleve occasionally  Antiplts/Anticoagulants/Anti thrombotics: none  GI Procedures: He reports undergoing EGD and a colonoscopy when he was shown 52 years old by [facility] GI. Reports are not available.  - Normal duodenal bulb and second portion of the duodenum. - Normal stomach. Biopsied. - Ectopic gastric mucosa in the upper third of the esophagus. - Normal gastroesophageal junction and esophagus.  - The examined portion of the ileum was normal. - One 5 mm polyp in the transverse colon, removed with a cold snare. Resected and retrieved. - Diverticulosis in the ascending colon. - The examination was otherwise normal. - The distal rectum and anal verge are normal on retroflexion view.    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 Allergy   . GERD (gastroesophageal reflux disease)     Past Surgical History:  Procedure Laterality Date  . COLONOSCOPY WITH PROPOFOL N/A 06/09/2017  Procedure: COLONOSCOPY WITH PROPOFOL;  Surgeon: Lin Landsman, MD;  Location: [place];  Service: Endoscopy;  Laterality: N/A;  . ESOPHAGOGASTRODUODENOSCOPY (EGD) WITH PROPOFOL N/A 06/09/2017   Procedure: ESOPHAGOGASTRODUODENOSCOPY (EGD) WITH PROPOFOL;  Surgeon: Lin Landsman, MD;  Location: [place];  Service: Endoscopy;  Laterality: N/A;  . NO PAST SURGERIES    . POLYPECTOMY N/A 06/09/2017   Procedure: POLYPECTOMY;  Surgeon: Lin Landsman, MD;  Location: [place];  Service: Endoscopy;  Laterality: N/A;     Current Outpatient Medications:  .  omeprazole (PRILOSEC) 20 MG capsule, Take 1 capsule (20 mg total) by mouth daily., Disp: 30 capsule, Rfl: 1   History reviewed. No pertinent family history.   Social History   Tobacco Use  . Smoking status: Former Smoker    Last attempt to quit: 1999    Years since quitting: 20.3  . Smokeless tobacco: Never Used  Substance Use Topics  . Alcohol use: Yes    Comment: occassionally - couple times per year  . Drug use: Never    Allergies as of 06/21/2017 - Review Complete 06/21/2017  Allergen Reaction Noted  . Tuberculin tests Rash 05/25/2017    Review of Systems:    All systems reviewed and negative except where noted in HPI.   Physical Exam:  BP 133/81   Pulse 65   Ht 5\' 1"  (1.549 m)   Wt 150 lb 12.8 oz (68.4 kg)   BMI 28.49 kg/m  No LMP for male patient.  General:   Alert,  Well-developed, well-nourished, pleasant and cooperative in NAD Head:  Normocephalic and atraumatic. Eyes:  Sclera clear, no icterus.   Conjunctiva pink. Ears:  Normal auditory acuity. Nose:  No deformity, discharge, or lesions. Mouth:  No deformity or lesions,oropharynx pink & moist. Neck:  Supple; no masses or thyromegaly. Lungs:  Respirations even and unlabored.  Clear throughout to auscultation.   No wheezes, crackles, or rhonchi. No acute distress. Heart:  Regular rate and rhythm; no murmurs, clicks, rubs, or gallops. Abdomen:  Normal bowel sounds. Soft, non-tender and non-distended without masses, hepatosplenomegaly or hernias noted.  No guarding or rebound tenderness.   Rectal: Not performed Msk:  Symmetrical without gross deformities. Good, equal movement & strength bilaterally. Pulses:  Normal pulses noted. Extremities:   No clubbing or edema.  No cyanosis. Neurologic:  Alert and oriented x3;  grossly normal neurologically. Skin:  Intact without significant lesions or rashes. No jaundice. Lymph Nodes:  No significant cervical adenopathy. Psych:  Alert and cooperative. Normal mood and affect.  Imaging Studies: [place] abd RUQ 05/31/17 IMPRESSION: No abnormality seen in the right upper quadrant of the abdomen.  Assessment and Plan:   Afshin Chrystal is a 52 y.o. [place] male with elevated transaminases of unclear etiology. There is no evidence of chronic liver disease. He does have chronic GERD, currently on omeprazole  Elevated LFTs - secondary liver disease workup came back unremarkable - advised him to stop the nutrition supplements - Recheck LFTs in 3-4 weeks, if persistently elevated, recommend liver biopsy  Chronic GERD EGD unremarkable Advised him to switch from omeprazole to H2 blocker daily and see if it helps  Colon cancer screening Colonoscopy was normal. Recommend repeat colonoscopy in 2029   Follow up in 2 months  Cephas Darby, MD

## 2017-07-08 ENCOUNTER — Other Ambulatory Visit: Payer: Self-pay

## 2017-07-08 ENCOUNTER — Other Ambulatory Visit
Admission: RE | Admit: 2017-07-08 | Discharge: 2017-07-08 | Disposition: A | Discharge status: Home / Self Care | Payer: BLUE CROSS/BLUE SHIELD | Source: Ambulatory Visit | Attending: Gastroenterology | Admitting: Gastroenterology

## 2017-07-08 DIAGNOSIS — R945 Abnormal results of liver function studies: Principal | ICD-10-CM

## 2017-07-08 DIAGNOSIS — R7989 Other specified abnormal findings of blood chemistry: Secondary | ICD-10-CM

## 2017-07-08 LAB — HEPATIC FUNCTION PANEL
ALT: 166 U/L — ABNORMAL HIGH (ref 17–63)
AST: 98 U/L — ABNORMAL HIGH (ref 15–41)
Albumin: 4.1 g/dL (ref 3.5–5.0)
Alkaline Phosphatase: 115 U/L (ref 38–126)
Bilirubin, Direct: <0.1 mg/dL — ABNORMAL LOW (ref 0.1–0.5)
Total Bilirubin: 0.6 mg/dL (ref 0.3–1.2)
Total Protein: 6.8 g/dL (ref 6.5–8.1)

## 2017-07-08 NOTE — Progress Notes (Signed)
hepatic 

## 2017-08-23 ENCOUNTER — Encounter: Payer: Self-pay | Admitting: Gastroenterology

## 2017-08-23 ENCOUNTER — Encounter (INDEPENDENT_AMBULATORY_CARE_PROVIDER_SITE_OTHER): Payer: Self-pay

## 2017-08-23 ENCOUNTER — Ambulatory Visit: Payer: BLUE CROSS/BLUE SHIELD | Admitting: Gastroenterology

## 2017-08-23 VITALS — BP 124/80 | HR 71 | Resp 17 | Ht 61.0 in | Wt 151.4 lb

## 2017-08-23 DIAGNOSIS — K219 Gastro-esophageal reflux disease without esophagitis: Secondary | ICD-10-CM

## 2017-08-23 DIAGNOSIS — R945 Abnormal results of liver function studies: Secondary | ICD-10-CM

## 2017-08-23 DIAGNOSIS — R7989 Other specified abnormal findings of blood chemistry: Secondary | ICD-10-CM

## 2017-08-23 NOTE — Progress Notes (Signed)
Cephas Darby, MD [address]  [place]  [place],  [address]  Main: [phone]  Fax: [phone]    Gastroenterology Consultation  Referring Provider:     Juline Patch, MD Primary Care Physician:  Juline Patch, MD Primary Gastroenterologist:  Dr. Cephas Darby Reason for Consultation:     Elevated LFTs        HPI:   Matthew Fletcher is a 52 y.o. male referred by Dr. Juline Patch, MD  for consultation & management of and elevated LFTs. Patient is Spanish-speaking male, from [place] moved to [place] in 1990s. He is incidentally found to have elevated LFTs. His initial abnormal LFTs are documented from 05/18/2017 alkaline phosphatase 124, AST 94, AST 163, T bili 0.6, total protein 7, albumin 4.6 by his primary care provider. He had repeat LFTs which are persistently elevated. Therefore, he is referred to GI for further consultation. Patient tells me that he was found to have abnormal LFTs few years ago when he was seen by [facility] clinic GI. He is not aware of any further workup done at that time. He had an ultrasound which did not reveal evidence of portal hypertension, cirrhosis or portal vein thrombosis. Patient reports frequent liquid Tylenol use from 2010 to 2013 secondary to frequent colds symptoms. When he found that his LFTs were elevated, he stopped taking Tylenol and switched to taking Aleve occasionally. He denies drinking heavily in the past. He does drink occasionally 2 beers per week. He denies IV drug abuse, blood transfusions, was not active in [employer], denies multiple sexual partners, sharing needles He also has chronic heartburn for which he takes omeprazole 20 mg daily before breakfast for the last 4-5 years He otherwise is asymptomatic, denies any other GI or liver related symptoms He takes prostate nutrition supplements, denies any other herbal supplements  Follow-up visit 06/21/2017: Workup for his elevated LFTs revealed mildly elevated  ferritin. Hereditary hemachromatosis panel came back negative. Ultrasound liver was unremarkable. I have asked him to stop taking prostate supplements which she stopped about 2 weeks ago.He underwent EGD and colonoscopy which were essentially normal. He continues to have heaviness in his left chest whenever he is upset or hearing any bad news. He had cardiac workup and that has been negative. He is on omeprazole 20 mg daily in the morning for heartburn. He wants to know if he could take it long-term or switch to another medication.   Follow-up visit 08/23/2017 He is here for follow-up of his LFTs. Since last visit, repeat LFTs are down trending. He denies any complaints today. He stopped the prostate supplements.  NSAIDs: Aleve occasionally  Antiplts/Anticoagulants/Anti thrombotics: none  GI Procedures: He reports undergoing EGD and a colonoscopy when he was shown 52 years old by [facility] clinic GI. Reports are not available.  - Normal duodenal bulb and second portion of the duodenum. - Normal stomach. Biopsied. - Ectopic gastric mucosa in the upper third of the esophagus. - Normal gastroesophageal junction and esophagus.  - The examined portion of the ileum was normal. - One 5 mm polyp in the transverse colon, removed with a cold snare. Resected and retrieved. - Diverticulosis in the ascending colon. - The examination was otherwise normal. - The distal rectum and anal verge are normal on retroflexion view.    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  Allergy   . GERD (gastroesophageal reflux disease)     Past Surgical History:  Procedure Laterality Date  . COLONOSCOPY WITH PROPOFOL N/A 06/09/2017   Procedure: COLONOSCOPY WITH PROPOFOL;  Surgeon: Lin Landsman, MD;  Location: [place];  Service: Endoscopy;  Laterality: N/A;  .  ESOPHAGOGASTRODUODENOSCOPY (EGD) WITH PROPOFOL N/A 06/09/2017   Procedure: ESOPHAGOGASTRODUODENOSCOPY (EGD) WITH PROPOFOL;  Surgeon: Lin Landsman, MD;  Location: [place];  Service: Endoscopy;  Laterality: N/A;  . NO PAST SURGERIES    . POLYPECTOMY N/A 06/09/2017   Procedure: POLYPECTOMY;  Surgeon: Lin Landsman, MD;  Location: [place];  Service: Endoscopy;  Laterality: N/A;     Current Outpatient Medications:  .  omeprazole (PRILOSEC) 20 MG capsule, Take 1 capsule (20 mg total) by mouth daily., Disp: 30 capsule, Rfl: 1   No family history on file.   Social History   Tobacco Use  . Smoking status: Former Smoker    Last attempt to quit: 1999    Years since quitting: 20.4  . Smokeless tobacco: Never Used  Substance Use Topics  . Alcohol use: Yes    Comment: occassionally - couple times per year  . Drug use: Never    Allergies as of 08/23/2017 - Review Complete 08/23/2017  Allergen Reaction Noted  . Tuberculin tests Rash 05/25/2017    Review of Systems:    All systems reviewed and negative except where noted in HPI.   Physical Exam:  BP 124/80 (BP Location: Left Arm, Patient Position: Sitting, Cuff Size: Normal)   Pulse 71   Resp 17   Ht 5\' 1"  (1.549 m)   Wt 151 lb 6.4 oz (68.7 kg)   BMI 28.61 kg/m  No LMP for male patient.  General:   Alert,  Well-developed, well-nourished, pleasant and cooperative in NAD Head:  Normocephalic and atraumatic. Eyes:  Sclera clear, no icterus.   Conjunctiva pink. Ears:  Normal auditory acuity. Nose:  No deformity, discharge, or lesions. Mouth:  No deformity or lesions,oropharynx pink & moist. Neck:  Supple; no masses or thyromegaly. Lungs:  Respirations even and unlabored.  Clear throughout to auscultation.   No wheezes, crackles, or rhonchi. No acute distress. Heart:  Regular rate and rhythm; no murmurs, clicks, rubs, or gallops. Abdomen:  Normal bowel sounds. Soft, non-tender and non-distended  without masses, hepatosplenomegaly or hernias noted.  No guarding or rebound tenderness.   Rectal: Not performed Msk:  Symmetrical without gross deformities. Good, equal movement & strength bilaterally. Pulses:  Normal pulses noted. Extremities:  No clubbing or edema.  No cyanosis. Neurologic:  Alert and oriented x3;  grossly normal neurologically. Skin:  Intact without significant lesions or rashes. No jaundice. Lymph Nodes:  No significant cervical adenopathy. Psych:  Alert and cooperative. Normal mood and affect.  Imaging Studies: [place] abd RUQ 05/31/17 IMPRESSION: No abnormality seen in the right upper quadrant of the abdomen.  Assessment and Plan:   Matthew Fletcher is a 52 y.o. [place] male with elevated transaminases of unclear etiology. There is no evidence of chronic liver disease. He does have chronic GERD, currently on omeprazole  Elevated LFTs - secondary liver disease workup came back unremarkable - continue to stop the nutrition supplements - Recheck LFTs in 3-4 weeks, if persistently elevated, recommend liver biopsy  Chronic GERD EGD unremarkable  Colon cancer screening Colonoscopy was normal. Recommend repeat colonoscopy in 2029   Follow up in 3 months  Cephas Darby, MD

## 2017-08-31 ENCOUNTER — Encounter: Payer: Self-pay | Admitting: *Deleted

## 2017-09-07 ENCOUNTER — Encounter
Admission: RE | Disposition: A | Discharge status: Home / Self Care | Payer: Self-pay | Source: Ambulatory Visit | Attending: Ophthalmology

## 2017-09-07 ENCOUNTER — Ambulatory Visit: Payer: BLUE CROSS/BLUE SHIELD | Admitting: Anesthesiology

## 2017-09-07 ENCOUNTER — Ambulatory Visit
Admission: RE | Admit: 2017-09-07 | Discharge: 2017-09-07 | Disposition: A | Discharge status: Home / Self Care | Payer: BLUE CROSS/BLUE SHIELD | Source: Ambulatory Visit | Attending: Ophthalmology | Admitting: Ophthalmology

## 2017-09-07 DIAGNOSIS — H2511 Age-related nuclear cataract, right eye: Secondary | ICD-10-CM | POA: Insufficient documentation

## 2017-09-07 DIAGNOSIS — Z87891 Personal history of nicotine dependence: Secondary | ICD-10-CM | POA: Diagnosis not present

## 2017-09-07 HISTORY — PX: CATARACT EXTRACTION W/PHACO: SHX586

## 2017-09-07 SURGERY — PHACOEMULSIFICATION, CATARACT, WITH IOL INSERTION
Anesthesia: Monitor Anesthesia Care | Site: Eye | Laterality: Right | Wound class: "Clean "

## 2017-09-07 MED ORDER — MOXIFLOXACIN HCL 0.5 % OP SOLN
OPHTHALMIC | Status: AC
  Filled 2017-09-07: qty 3

## 2017-09-07 MED ORDER — TETRACAINE HCL 0.5 % OP SOLN
1.0000 [drp] | OPHTHALMIC | Status: AC | PRN
  Administered 2017-09-07 (×4): 1 [drp] via OPHTHALMIC

## 2017-09-07 MED ORDER — EPINEPHRINE PF 1 MG/ML IJ SOLN
INTRAOCULAR | Status: DC | PRN
  Administered 2017-09-07: 09:00:00 via OPHTHALMIC

## 2017-09-07 MED ORDER — CYCLOPENTOLATE HCL 2 % OP SOLN
OPHTHALMIC | Status: AC
  Administered 2017-09-07: 1 [drp] via OPHTHALMIC
  Filled 2017-09-07: qty 2

## 2017-09-07 MED ORDER — CYCLOPENTOLATE HCL 2 % OP SOLN
1.0000 [drp] | OPHTHALMIC | Status: AC | PRN
  Administered 2017-09-07 (×4): 1 [drp] via OPHTHALMIC

## 2017-09-07 MED ORDER — GLYCOPYRROLATE 0.2 MG/ML IJ SOLN
INTRAMUSCULAR | Status: DC | PRN
  Administered 2017-09-07: 0.1 mg via INTRAVENOUS

## 2017-09-07 MED ORDER — PHENYLEPHRINE HCL 10 % OP SOLN
OPHTHALMIC | Status: AC
  Administered 2017-09-07: 1 [drp] via OPHTHALMIC
  Filled 2017-09-07: qty 5

## 2017-09-07 MED ORDER — PHENYLEPHRINE HCL 10 % OP SOLN
1.0000 [drp] | OPHTHALMIC | Status: AC | PRN
  Administered 2017-09-07 (×4): 1 [drp] via OPHTHALMIC

## 2017-09-07 MED ORDER — POVIDONE-IODINE 5 % OP SOLN
OPHTHALMIC | Status: AC
  Filled 2017-09-07: qty 30

## 2017-09-07 MED ORDER — EPINEPHRINE PF 1 MG/ML IJ SOLN
INTRAMUSCULAR | Status: AC
  Filled 2017-09-07: qty 2

## 2017-09-07 MED ORDER — GLYCOPYRROLATE 0.2 MG/ML IJ SOLN
INTRAMUSCULAR | Status: AC
  Filled 2017-09-07: qty 1

## 2017-09-07 MED ORDER — NA CHONDROIT SULF-NA HYALURON 40-17 MG/ML IO SOLN
INTRAOCULAR | Status: DC | PRN
  Administered 2017-09-07: 1 mL via INTRAOCULAR

## 2017-09-07 MED ORDER — MOXIFLOXACIN HCL 0.5 % OP SOLN
OPHTHALMIC | Status: DC | PRN
  Administered 2017-09-07: 0.2 mL via OPHTHALMIC

## 2017-09-07 MED ORDER — NA CHONDROIT SULF-NA HYALURON 40-17 MG/ML IO SOLN
INTRAOCULAR | Status: AC
  Filled 2017-09-07: qty 1

## 2017-09-07 MED ORDER — LIDOCAINE HCL (PF) 4 % IJ SOLN
INTRAMUSCULAR | Status: AC
  Filled 2017-09-07: qty 5

## 2017-09-07 MED ORDER — LIDOCAINE HCL (PF) 4 % IJ SOLN
INTRAOCULAR | Status: DC | PRN
  Administered 2017-09-07: 4 mL via OPHTHALMIC

## 2017-09-07 MED ORDER — FENTANYL CITRATE (PF) 100 MCG/2ML IJ SOLN
INTRAMUSCULAR | Status: AC
  Filled 2017-09-07: qty 2

## 2017-09-07 MED ORDER — MIDAZOLAM HCL 2 MG/2ML IJ SOLN
INTRAMUSCULAR | Status: DC | PRN
  Administered 2017-09-07: 2 mg via INTRAVENOUS

## 2017-09-07 MED ORDER — SODIUM CHLORIDE 0.9 % IV SOLN
INTRAVENOUS | Status: DC
  Administered 2017-09-07: 08:00:00 via INTRAVENOUS

## 2017-09-07 MED ORDER — TETRACAINE HCL 0.5 % OP SOLN
OPHTHALMIC | Status: AC
  Administered 2017-09-07: 09:00:00
  Filled 2017-09-07: qty 4

## 2017-09-07 MED ORDER — MOXIFLOXACIN HCL 0.5 % OP SOLN: 1.0000 [drp] | OPHTHALMIC | Status: DC | PRN

## 2017-09-07 MED ORDER — CARBACHOL 0.01 % IO SOLN
INTRAOCULAR | Status: DC | PRN
  Administered 2017-09-07: 0.5 mL via INTRAOCULAR

## 2017-09-07 MED ORDER — MIDAZOLAM HCL 2 MG/2ML IJ SOLN
INTRAMUSCULAR | Status: AC
  Filled 2017-09-07: qty 2

## 2017-09-07 MED ORDER — POVIDONE-IODINE 5 % OP SOLN
OPHTHALMIC | Status: DC | PRN
  Administered 2017-09-07: 1 via OPHTHALMIC

## 2017-09-07 SURGICAL SUPPLY — 16 items
GLOVE BIO SURGEON STRL SZ8 (GLOVE) ×2 IMPLANT
GLOVE BIOGEL M 6.5 STRL (GLOVE) ×2 IMPLANT
GLOVE SURG LX 8.0 MICRO (GLOVE) ×1
GLOVE SURG LX STRL 8.0 MICRO (GLOVE) ×1 IMPLANT
GOWN STRL REUS W/ TWL LRG LVL3 (GOWN DISPOSABLE) ×2 IMPLANT
GOWN STRL REUS W/TWL LRG LVL3 (GOWN DISPOSABLE) ×2
LABEL CATARACT MEDS ST (LABEL) ×2 IMPLANT
LENS IOL TECNIS ITEC 21.5 (Intraocular Lens) ×1 IMPLANT
PACK CATARACT (MISCELLANEOUS) ×2 IMPLANT
PACK CATARACT BRASINGTON LX (MISCELLANEOUS) ×2 IMPLANT
PACK EYE AFTER SURG (MISCELLANEOUS) ×2 IMPLANT
SOL BSS BAG (MISCELLANEOUS) ×2
SOLUTION BSS BAG (MISCELLANEOUS) ×1 IMPLANT
SYR 5ML LL (SYRINGE) ×2 IMPLANT
WATER STERILE IRR 250ML POUR (IV SOLUTION) ×2 IMPLANT
WIPE NON LINTING 3.25X3.25 (MISCELLANEOUS) ×2 IMPLANT

## 2017-09-07 NOTE — OR Nursing (Signed)
Drop schedule explained to pt and family. All voice understanding. discharge instructions discussed with pt and family. All voice understanding. Pt using imprimis drops.

## 2017-09-07 NOTE — Anesthesia Preprocedure Evaluation (Signed)
Anesthesia Evaluation  Patient identified by MRN, date of birth, ID band  Reviewed: NPO status   History of Anesthesia Complications Negative for: history of anesthetic complications  Airway Mallampati: II  TM Distance: >3 FB Neck ROM: full    Dental  (+) Partial Upper   Pulmonary neg pulmonary ROS, former smoker,    Pulmonary exam normal        Cardiovascular Exercise Tolerance: Good + angina (atypical, non cardiac) negative cardio ROS Normal cardiovascular exam  ekg: nsr;  cards stable: 05/2017: dr. Nehemiah Massed; normal stress test without evidence of myocardial ischemia.;     Neuro/Psych negative neurological ROS  negative psych ROS   GI/Hepatic GERD  Controlled,Elevated LFTs   Endo/Other  negative endocrine ROS  Renal/GU negative Renal ROS  negative genitourinary   Musculoskeletal   Abdominal   Peds  Hematology negative hematology ROS (+)   Anesthesia Other Findings   Reproductive/Obstetrics                             Anesthesia Physical  Anesthesia Plan  ASA: II  Anesthesia Plan: MAC   Post-op Pain Management:    Induction: Intravenous  PONV Risk Score and Plan:   Airway Management Planned: Nasal Cannula  Additional Equipment:   Intra-op Plan:   Post-operative Plan:   Informed Consent: I have reviewed the patients History and Physical, chart, labs and discussed the procedure including the risks, benefits and alternatives for the proposed anesthesia with the patient or authorized representative who has indicated his/her understanding and acceptance.     Plan Discussed with: CRNA  Anesthesia Plan Comments:         Anesthesia Quick Evaluation

## 2017-09-07 NOTE — Anesthesia Postprocedure Evaluation (Signed)
Anesthesia Post Note  Patient: Matthew Fletcher  Procedure(s) Performed: CATARACT EXTRACTION PHACO AND INTRAOCULAR LENS PLACEMENT ([place]) (Right Eye)  Patient location during evaluation: Short Stay Anesthesia Type: MAC Level of consciousness: awake and alert, oriented and patient cooperative Pain management: satisfactory to patient Vital Signs Assessment: post-procedure vital signs reviewed and stable Respiratory status: spontaneous breathing and respiratory function stable Cardiovascular status: blood pressure returned to baseline and stable Postop Assessment: no headache, no backache, patient able to bend at knees, no apparent nausea or vomiting, adequate PO intake and able to ambulate Anesthetic complications: no     Last Vitals:  Vitals:   09/07/17 0810 09/07/17 0934  BP: 100/80 107/70  Pulse: (!) 55 (!) 54  Resp: 17 16  Temp: (!) 36.4 C 36.8 C  SpO2: 100% 100%    Last Pain:  Vitals:   09/07/17 0810  TempSrc: Tympanic                 [name]

## 2017-09-07 NOTE — Anesthesia Post-op Follow-up Note (Signed)
Anesthesia QCDR form completed.        

## 2017-09-07 NOTE — Transfer of Care (Signed)
Immediate Anesthesia Transfer of Care Note  Patient: Matthew Fletcher  Procedure(s) Performed: CATARACT EXTRACTION PHACO AND INTRAOCULAR LENS PLACEMENT ([facility]) (Right Eye)  Patient Location: PACU and Short Stay  Anesthesia Type:MAC  Level of Consciousness: awake, alert , oriented and patient cooperative  Airway & Oxygen Therapy: Patient Spontanous Breathing  Post-op Assessment: Report given to RN, Post -op Vital signs reviewed and stable and Patient moving all extremities  Post vital signs: Reviewed and stable  Last Vitals:  Vitals Value Taken Time  BP 107/70 09/07/2017  9:34 AM  Temp 36.8 C 09/07/2017  9:34 AM  Pulse 54 09/07/2017  9:34 AM  Resp 16 09/07/2017  9:34 AM  SpO2 100 % 09/07/2017  9:34 AM    Last Pain:  Vitals:   09/07/17 0810  TempSrc: Tympanic         Complications: No apparent anesthesia complications

## 2017-09-07 NOTE — Op Note (Signed)
PREOPERATIVE DIAGNOSIS:  Nuclear sclerotic cataract of the right eye.   POSTOPERATIVE DIAGNOSIS:  NUCLEAR SCLEROTIC CATARACT RIGHT EYE   OPERATIVE PROCEDURE: Procedure(s): CATARACT EXTRACTION PHACO AND INTRAOCULAR LENS PLACEMENT ([facility])   SURGEON:  Birder Robson, MD.   ANESTHESIA:  Anesthesiologist: Martha Clan, MD CRNA: Nile Riggs, CRNA  1.      Managed anesthesia care. 2.      0.94ml of Shugarcaine was instilled in the eye following the paracentesis.   COMPLICATIONS:  None.   TECHNIQUE:   Stop and chop   DESCRIPTION OF PROCEDURE:  The patient was examined and consented in the preoperative holding area where the aforementioned topical anesthesia was applied to the right eye and then brought back to the Operating Room where the right eye was prepped and draped in the usual sterile ophthalmic fashion and a lid speculum was placed. A paracentesis was created with the side port blade and the anterior chamber was filled with viscoelastic. A near clear corneal incision was performed with the steel keratome. A continuous curvilinear capsulorrhexis was performed with a cystotome followed by the capsulorrhexis forceps. Hydrodissection and hydrodelineation were carried out with BSS on a blunt cannula. The lens was removed in a stop and chop  technique and the remaining cortical material was removed with the irrigation-aspiration handpiece. The capsular bag was inflated with viscoelastic and the Technis [id number]  lens was placed in the capsular bag without complication. The remaining viscoelastic was removed from the eye with the irrigation-aspiration handpiece. The wounds were hydrated. The anterior chamber was flushed with Miostat and the eye was inflated to physiologic pressure. 0.51ml of Vigamox was placed in the anterior chamber. The wounds were found to be water tight. The eye was dressed with Vigamox. The patient was given protective glasses to wear throughout the day and a shield with  which to sleep tonight. The patient was also given drops with which to begin a drop regimen today and will follow-up with me in one day. Implant Name Type Inv. Item Serial No. Manufacturer Lot No. LRB No. Used  LENS IOL DIOP 21.5 - [id number] Intraocular Lens LENS IOL DIOP 21.5 [id number]  Right 1   Procedure(s) with comments: CATARACT EXTRACTION PHACO AND INTRAOCULAR LENS PLACEMENT ([facility]) (Right) - [place] 00:28.5 AP% 15.5 CDE 4.40 Fluid pack lot # [id number]  Electronically signed: Birder Robson 09/07/2017 9:32 AM

## 2017-09-07 NOTE — H&P (Signed)
All labs reviewed. Abnormal studies sent to patients PCP when indicated.  Previous H&P reviewed, patient examined, there are NO CHANGES.  Matthew Manera Porfilio7/9/20199:04 AM

## 2017-09-07 NOTE — Discharge Instructions (Signed)
Eye Surgery Discharge Instructions    Expect mild scratchy sensation or mild soreness. DO NOT RUB YOUR EYE!  The day of surgery:  Minimal physical activity, but bed rest is not required  No reading, computer work, or close hand work  No bending, lifting, or straining.  May watch TV  For 24 hours:  No driving, legal decisions, or alcoholic beverages  Safety precautions  Eat anything you prefer: It is better to start with liquids, then soup then solid foods.  _____ Eye patch should be worn until postoperative exam tomorrow.  ____ Solar shield eyeglasses should be worn for comfort in the sunlight/patch while sleeping  Resume all regular medications including aspirin or Coumadin if these were discontinued prior to surgery. You may shower, bathe, shave, or wash your hair. Tylenol may be taken for mild discomfort.  Call your doctor if you experience significant pain, nausea, or vomiting, fever > 101 or other signs of infection. [phone] or [phone] Specific instructions:  Follow-up Information    Birder Robson, MD Follow up.   Specialty:  Ophthalmology Why:  July 9 at 10:00 am Contact information: [place] [address] [phone]

## 2017-09-14 ENCOUNTER — Other Ambulatory Visit: Payer: Self-pay | Admitting: Family Medicine

## 2017-09-14 DIAGNOSIS — R945 Abnormal results of liver function studies: Secondary | ICD-10-CM

## 2017-09-14 DIAGNOSIS — R7989 Other specified abnormal findings of blood chemistry: Secondary | ICD-10-CM

## 2017-11-24 ENCOUNTER — Ambulatory Visit: Payer: BLUE CROSS/BLUE SHIELD | Admitting: Gastroenterology

## 2017-12-09 ENCOUNTER — Ambulatory Visit: Payer: BLUE CROSS/BLUE SHIELD | Admitting: Gastroenterology

## 2017-12-09 ENCOUNTER — Other Ambulatory Visit: Payer: Self-pay

## 2017-12-09 DIAGNOSIS — R945 Abnormal results of liver function studies: Principal | ICD-10-CM

## 2017-12-09 DIAGNOSIS — R7989 Other specified abnormal findings of blood chemistry: Secondary | ICD-10-CM

## 2017-12-29 ENCOUNTER — Other Ambulatory Visit
Admission: RE | Admit: 2017-12-29 | Discharge: 2017-12-29 | Disposition: A | Discharge status: Home / Self Care | Payer: BLUE CROSS/BLUE SHIELD | Source: Ambulatory Visit | Attending: Gastroenterology | Admitting: Gastroenterology

## 2017-12-29 DIAGNOSIS — R945 Abnormal results of liver function studies: Secondary | ICD-10-CM | POA: Insufficient documentation

## 2017-12-29 DIAGNOSIS — R7989 Other specified abnormal findings of blood chemistry: Secondary | ICD-10-CM

## 2017-12-29 LAB — BASIC METABOLIC PANEL
Anion gap: 10 (ref 5–15)
BUN: 18 mg/dL (ref 6–20)
CO2: 23 mmol/L (ref 22–32)
Calcium: 8.9 mg/dL (ref 8.9–10.3)
Chloride: 105 mmol/L (ref 98–111)
Creatinine, Ser: 0.68 mg/dL (ref 0.61–1.24)
GFR calc Af Amer: >60 mL/min (ref >60)
GFR calc non Af Amer: >60 mL/min (ref >60)
Glucose, Bld: 167 mg/dL — ABNORMAL HIGH (ref 70–99)
Potassium: 4 mmol/L (ref 3.5–5.1)
Sodium: 138 mmol/L (ref 135–145)

## 2017-12-29 LAB — PROTIME-INR
INR: 1.08
Prothrombin Time: 13.9 seconds (ref 11.4–15.2)

## 2017-12-29 LAB — CBC
HCT: 41.1 % (ref 39.0–52.0)
Hemoglobin: 14.1 g/dL (ref 13.0–17.0)
MCH: 32.4 pg (ref 26.0–34.0)
MCHC: 34.3 g/dL (ref 30.0–36.0)
MCV: 94.5 fL (ref 80.0–100.0)
Platelets: 176 10*3/uL (ref 150–400)
RBC: 4.35 MIL/uL (ref 4.22–5.81)
RDW: 12.8 % (ref 11.5–15.5)
WBC: 6.8 10*3/uL (ref 4.0–10.5)
nRBC: 0 % (ref 0.0–0.2)

## 2017-12-29 LAB — HEMOGLOBIN A1C
Hgb A1c MFr Bld: 5.4 % (ref 4.8–5.6)
Mean Plasma Glucose: 108.28 mg/dL

## 2017-12-29 LAB — FERRITIN: Ferritin: 472 ng/mL — ABNORMAL HIGH (ref 24–336)

## 2017-12-29 LAB — TSH: TSH: 2.292 u[IU]/mL (ref 0.350–4.500)

## 2017-12-30 LAB — ALPHA-1-ANTITRYPSIN: A-1 Antitrypsin, Ser: 148 mg/dL (ref 101–187)

## 2017-12-30 LAB — CERULOPLASMIN: Ceruloplasmin: 21.2 mg/dL (ref 16.0–31.0)

## 2017-12-30 LAB — HEPATITIS PANEL, ACUTE
HCV Ab: <0.1 s/co ratio (ref 0.0–0.9)
Hep A IgM: NEGATIVE
Hep B C IgM: NEGATIVE
Hepatitis B Surface Ag: NEGATIVE

## 2017-12-30 LAB — ANA: Anti Nuclear Antibody(ANA): NEGATIVE

## 2017-12-30 LAB — HIV ANTIBODY (ROUTINE TESTING W REFLEX): HIV Screen 4th Generation wRfx: NONREACTIVE

## 2017-12-30 LAB — MITOCHONDRIAL ANTIBODIES: Mitochondrial M2 Ab, IgG: <20 Units (ref 0.0–20.0)

## 2017-12-30 LAB — ANTI-SMOOTH MUSCLE ANTIBODY, IGG: F-Actin IgG: 3 Units (ref 0–19)

## 2018-01-11 ENCOUNTER — Encounter (INDEPENDENT_AMBULATORY_CARE_PROVIDER_SITE_OTHER): Payer: Self-pay

## 2018-01-11 ENCOUNTER — Ambulatory Visit (INDEPENDENT_AMBULATORY_CARE_PROVIDER_SITE_OTHER): Payer: BLUE CROSS/BLUE SHIELD | Admitting: Gastroenterology

## 2018-01-11 ENCOUNTER — Encounter: Payer: Self-pay | Admitting: Gastroenterology

## 2018-01-11 VITALS — BP 133/81 | HR 64 | Resp 16 | Ht 61.0 in | Wt 152.4 lb

## 2018-01-11 DIAGNOSIS — R0789 Other chest pain: Secondary | ICD-10-CM

## 2018-01-11 DIAGNOSIS — K219 Gastro-esophageal reflux disease without esophagitis: Secondary | ICD-10-CM

## 2018-01-11 DIAGNOSIS — R945 Abnormal results of liver function studies: Secondary | ICD-10-CM | POA: Diagnosis not present

## 2018-01-11 DIAGNOSIS — R7989 Other specified abnormal findings of blood chemistry: Secondary | ICD-10-CM

## 2018-01-11 MED ORDER — OMEPRAZOLE 40 MG PO CPDR: 40.0000 mg | DELAYED_RELEASE_CAPSULE | Freq: Every day | ORAL | 0 refills | Status: DC

## 2018-01-11 NOTE — Progress Notes (Signed)
Matthew Darby, MD [address]  [place]  [place],  [address]  Main: [phone]  Fax: [phone]    Gastroenterology Consultation  Referring Provider:     Juline Patch, MD Primary Care Physician:  Juline Patch, MD Primary Gastroenterologist:  Dr. Cephas Fletcher Reason for Consultation:     Elevated LFTs        HPI:   Matthew Fletcher is a 52 y.o. male referred by Dr. Juline Patch, MD  for consultation & management of and elevated LFTs. Patient is Spanish-speaking male, from [place] moved to [place] in 1990s. He is incidentally found to have elevated LFTs. His initial abnormal LFTs are documented from 05/18/2017 alkaline phosphatase 124, AST 94, AST 163, T bili 0.6, total protein 7, albumin 4.6 by his primary care provider. He had repeat LFTs which are persistently elevated. Therefore, he is referred to GI for further consultation. Patient tells me that he was found to have abnormal LFTs few years ago when he was seen by [facility] clinic GI. He is not aware of any further workup done at that time. He had an ultrasound which did not reveal evidence of portal hypertension, cirrhosis or portal vein thrombosis. Patient reports frequent liquid Tylenol use from 2010 to 2013 secondary to frequent colds symptoms. When he found that his LFTs were elevated, he stopped taking Tylenol and switched to taking Aleve occasionally. He denies drinking heavily in the past. He does drink occasionally 2 beers per week. He denies IV drug abuse, blood transfusions, was not active in [employer], denies multiple sexual partners, sharing needles He also has chronic heartburn for which he takes omeprazole 20 mg daily before breakfast for the last 4-5 years He otherwise is asymptomatic, denies any other GI or liver related symptoms He takes prostate nutrition supplements, denies any other herbal supplements  Follow-up visit 06/21/2017: Workup for his elevated LFTs revealed mildly elevated  ferritin. Hereditary hemachromatosis panel came back negative. Ultrasound liver was unremarkable. I have asked him to stop taking prostate supplements which she stopped about 2 weeks ago.He underwent EGD and colonoscopy which were essentially normal. He continues to have heaviness in his left chest whenever he is upset or hearing any bad news. He had cardiac workup and that has been negative. He is on omeprazole 20 mg daily in the morning for heartburn. He wants to know if he could take it long-term or switch to another medication.   Follow-up visit 08/23/2017 He is here for follow-up of his LFTs. Since last visit, repeat LFTs are down trending. He denies any complaints today. He stopped the prostate supplements.  Follow-up visit 01/11/2018 Patient recently underwent repeat work-up for secondary liver disease which was unremarkable.  However, LFTs were not checked.  He continues to have atypical chest pain and heartburn on omeprazole 20 mg daily.  He continues to have dull morning headaches.  NSAIDs: Aleve occasionally  Antiplts/Anticoagulants/Anti thrombotics: none  GI Procedures: He reports undergoing EGD and a colonoscopy when he was shown 52 years old by [facility] clinic GI. Reports are not available.  - Normal duodenal bulb and second portion of the duodenum. - Normal stomach. Biopsied. - Ectopic gastric mucosa in the upper third of the esophagus. - Normal gastroesophageal junction and esophagus.  - The examined portion of the ileum was normal. - One 5 mm polyp in the transverse colon, removed with a cold snare. Resected and retrieved. - Diverticulosis in the ascending colon. - The examination was otherwise  normal. - The distal rectum and anal verge are normal on retroflexion view.    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 Allergy   . GERD  (gastroesophageal reflux disease)     Past Surgical History:  Procedure Laterality Date  . CATARACT EXTRACTION W/PHACO Right 09/07/2017   Procedure: CATARACT EXTRACTION PHACO AND INTRAOCULAR LENS PLACEMENT ([facility]);  Surgeon: Birder Robson, MD;  Location: [facility];  Service: Ophthalmology;  Laterality: Right;  [place] 00:28.5 AP% 15.5 CDE 4.40 Fluid pack lot # [id number]  . COLONOSCOPY WITH PROPOFOL N/A 06/09/2017   Procedure: COLONOSCOPY WITH PROPOFOL;  Surgeon: Lin Landsman, MD;  Location: [place];  Service: Endoscopy;  Laterality: N/A;  . ESOPHAGOGASTRODUODENOSCOPY (EGD) WITH PROPOFOL N/A 06/09/2017   Procedure: ESOPHAGOGASTRODUODENOSCOPY (EGD) WITH PROPOFOL;  Surgeon: Lin Landsman, MD;  Location: [place];  Service: Endoscopy;  Laterality: N/A;  . NO PAST SURGERIES    . POLYPECTOMY N/A 06/09/2017   Procedure: POLYPECTOMY;  Surgeon: Lin Landsman, MD;  Location: [place];  Service: Endoscopy;  Laterality: N/A;     Current Outpatient Medications:  .  omeprazole (PRILOSEC) 40 MG capsule, Take 1 capsule (40 mg total) by mouth daily., Disp: 90 capsule, Rfl: 0   No family history on file.   Social History   Tobacco Use  . Smoking status: Former Smoker    Last attempt to quit: 1999    Years since quitting: 20.8  . Smokeless tobacco: Never Used  Substance Use Topics  . Alcohol use: Yes    Comment: occassionally - couple times per year  . Drug use: Never    Allergies as of 01/11/2018 - Review Complete 01/11/2018  Allergen Reaction Noted  . Tuberculin tests Rash 05/25/2017    Review of Systems:    All systems reviewed and negative except where noted in HPI.   Physical Exam:  BP 133/81 (BP Location: Left Arm, Patient Position: Sitting, Cuff Size: Normal)   Pulse 64   Resp 16   Ht 5\' 1"  (1.549 m)   Wt 152 lb 6.4 oz (69.1 kg)   BMI 28.80 kg/m  No LMP for male patient.  General:   Alert,  Well-developed, well-nourished,  pleasant and cooperative in NAD Head:  Normocephalic and atraumatic. Eyes:  Sclera clear, no icterus.   Conjunctiva pink. Ears:  Normal auditory acuity. Nose:  No deformity, discharge, or lesions. Mouth:  No deformity or lesions,oropharynx pink & moist. Neck:  Supple; no masses or thyromegaly. Lungs:  Respirations even and unlabored.  Clear throughout to auscultation.   No wheezes, crackles, or rhonchi. No acute distress. Heart:  Regular rate and rhythm; no murmurs, clicks, rubs, or gallops. Abdomen:  Normal bowel sounds. Soft, non-tender and non-distended without masses, hepatosplenomegaly or hernias noted.  No guarding or rebound tenderness.   Rectal: Not performed Msk:  Symmetrical without gross deformities. Good, equal movement & strength bilaterally. Pulses:  Normal pulses noted. Extremities:  No clubbing or edema.  No cyanosis. Neurologic:  Alert and oriented x3;  grossly normal neurologically. Skin:  Intact without significant lesions or rashes. No jaundice. Lymph Nodes:  No significant cervical adenopathy. Psych:  Alert and cooperative. Normal mood and affect.  Imaging Studies: [place] abd RUQ 05/31/17 IMPRESSION: No abnormality seen in the right upper quadrant of the abdomen.  Assessment and Plan:   Matthew Fletcher is  a 52 y.o. [place] male with elevated transaminases of unclear etiology. There is no evidence of chronic liver disease. He does have chronic GERD, currently on omeprazole  Elevated LFTs - secondary liver disease workup came back unremarkable - continue to stop the nutrition supplements - Recheck LFTs today, if persistently elevated, recommend liver biopsy  Chronic GERD EGD unremarkable Increase omeprazole to 40 mg daily  Colon cancer screening Colonoscopy was normal. Recommend repeat colonoscopy in 2029  Chronic dull early morning headaches We will send a note to his primary care doctor to see if he needs a CT head   Follow up in 3 months  Matthew Darby, MD

## 2018-01-12 LAB — HEPATIC FUNCTION PANEL
ALT: 243 IU/L — ABNORMAL HIGH (ref 0–44)
AST: 157 IU/L — ABNORMAL HIGH (ref 0–40)
Albumin: 4.1 g/dL (ref 3.5–5.5)
Alkaline Phosphatase: 125 IU/L — ABNORMAL HIGH (ref 39–117)
Bilirubin Total: 0.7 mg/dL (ref 0.0–1.2)
Bilirubin, Direct: 0.19 mg/dL (ref 0.00–0.40)
Total Protein: 6.7 g/dL (ref 6.0–8.5)

## 2018-04-08 ENCOUNTER — Other Ambulatory Visit: Payer: Self-pay | Admitting: Gastroenterology

## 2018-04-12 ENCOUNTER — Telehealth: Payer: Self-pay | Admitting: Gastroenterology

## 2018-04-12 NOTE — Telephone Encounter (Signed)
Patient came in & states he would like more refills on his Omeprazole DR 40 MG capsule there are not any.He states this in working for him.Please call into [facility] [address] In [place].

## 2018-04-13 NOTE — Telephone Encounter (Signed)
Spoke with pt and he has been notified that it is too soon to refill this medication due to it was prescribed on 04/08/2018 with a 3 month supply, I asked pt to call pharmacy once medication is almost out for refill

## 2018-07-03 ENCOUNTER — Other Ambulatory Visit: Payer: Self-pay | Admitting: Gastroenterology

## 2018-10-01 ENCOUNTER — Other Ambulatory Visit: Payer: Self-pay | Admitting: Gastroenterology

## 2018-10-05 ENCOUNTER — Telehealth: Payer: Self-pay | Admitting: Gastroenterology

## 2018-10-05 ENCOUNTER — Other Ambulatory Visit: Payer: Self-pay | Admitting: Gastroenterology

## 2018-10-05 NOTE — Telephone Encounter (Signed)
Patient called to ask for refill on omeprazole (PRILOSEC) 40 MG capsule [facility] [place]. Please call patient to let him know when it's called in.

## 2019-02-13 IMAGING — US US ABDOMEN LIMITED
1 series · 14 of 25 positions shown · non-contrast
Comparison: CT scan of March 11, 2012.

CLINICAL DATA: Elevated liver function tests.

EXAM:
ULTRASOUND ABDOMEN LIMITED RIGHT UPPER QUADRANT

[Series 1: us abdomen limited · 0.18mm/px · 14 of 50 slices shown]
[im 1/50]
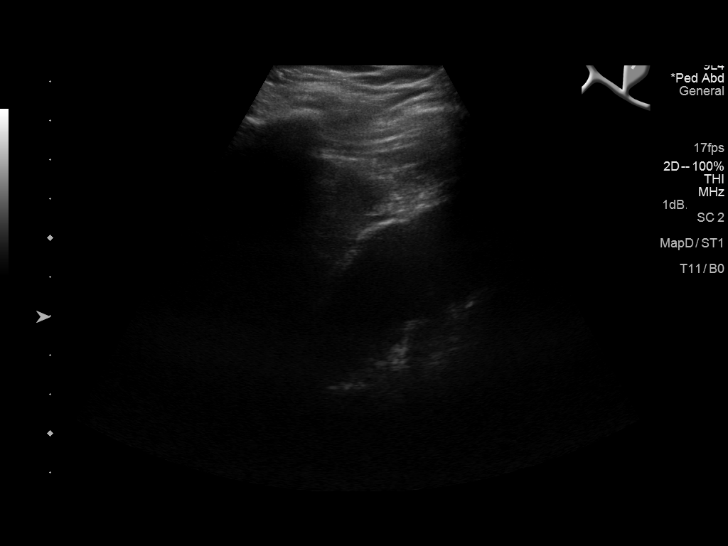
[im 5/50]
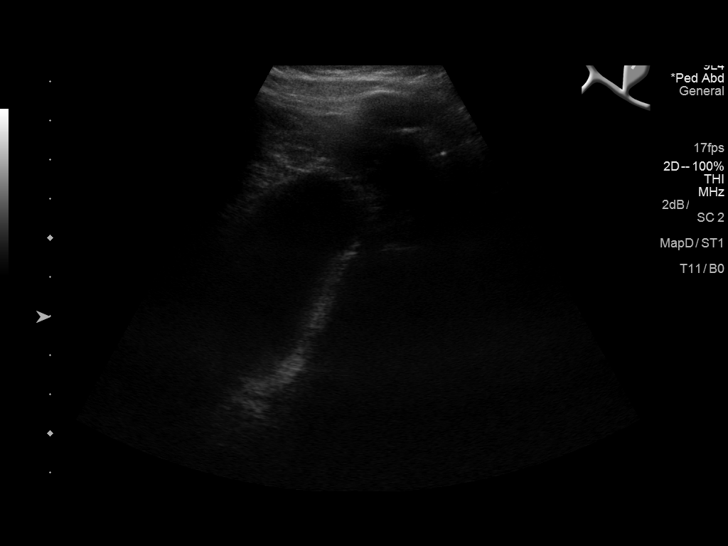
[im 9/50]
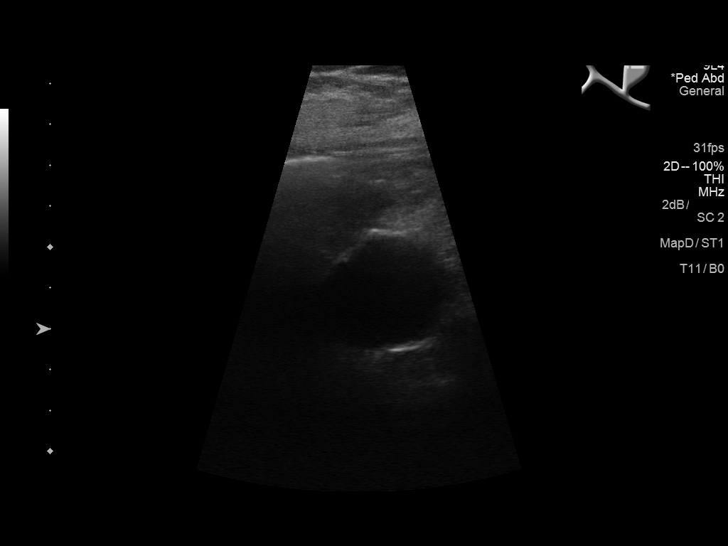
[im 13/50]
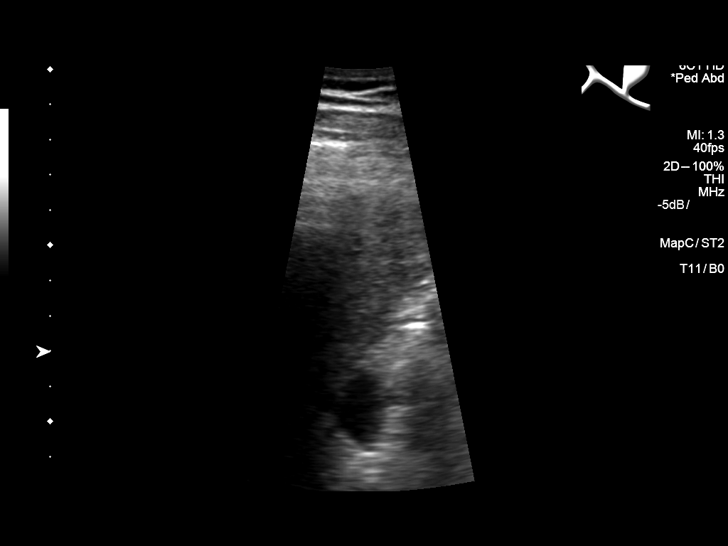
[im 17/50]
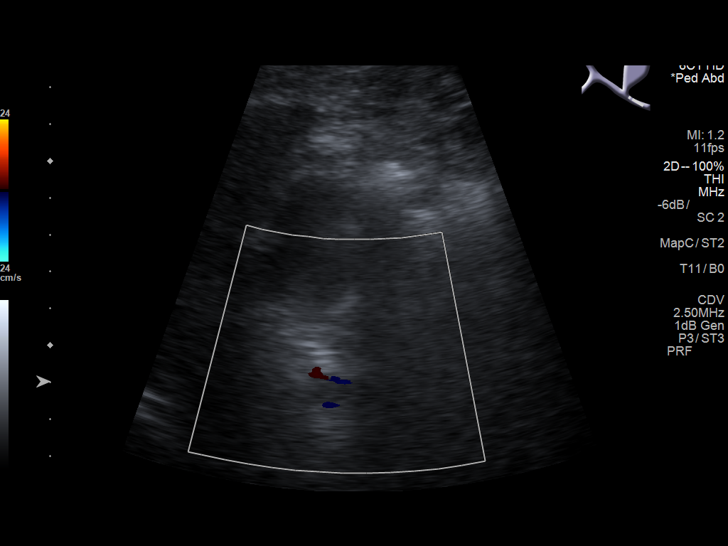
[im 19/50]
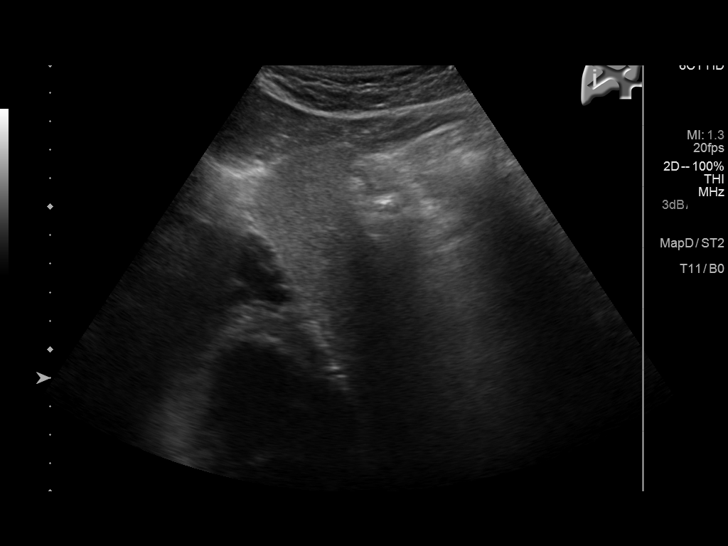
[im 23/50]
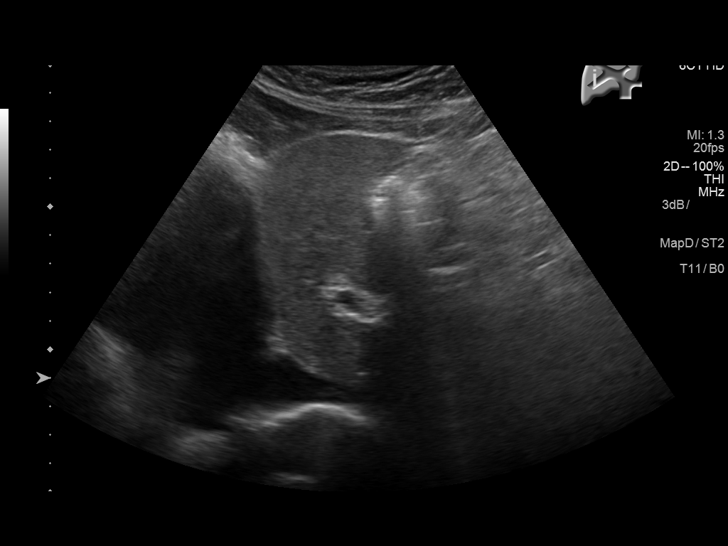
[im 27/50]
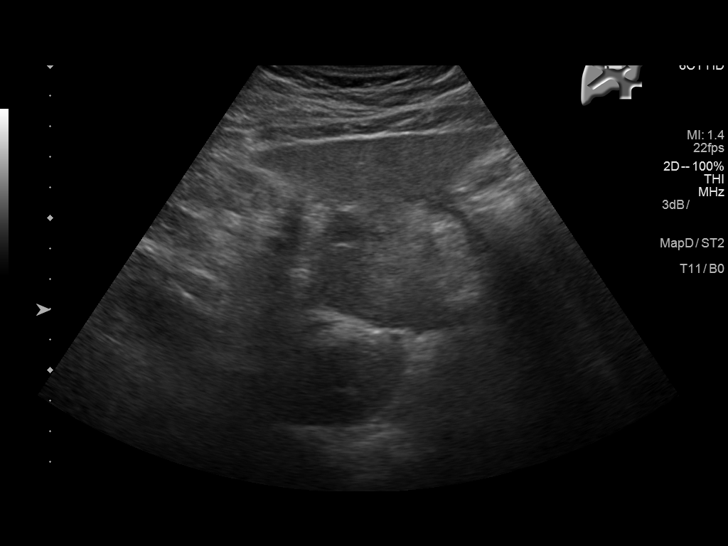
[im 31/50]
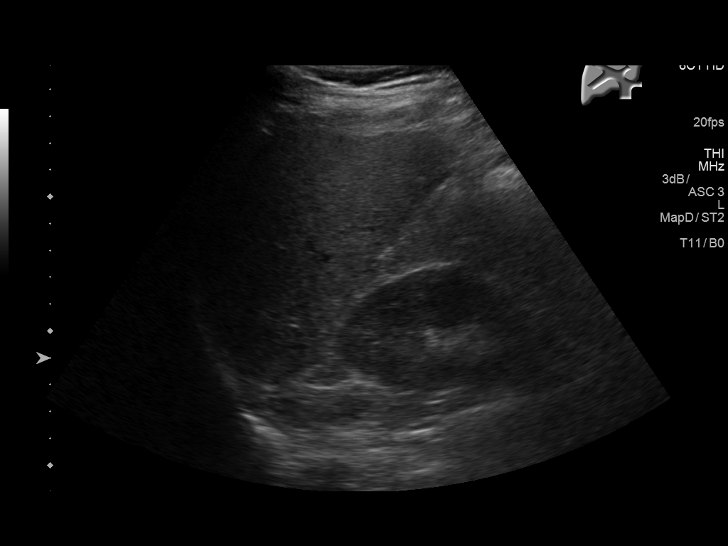
[im 33/50]
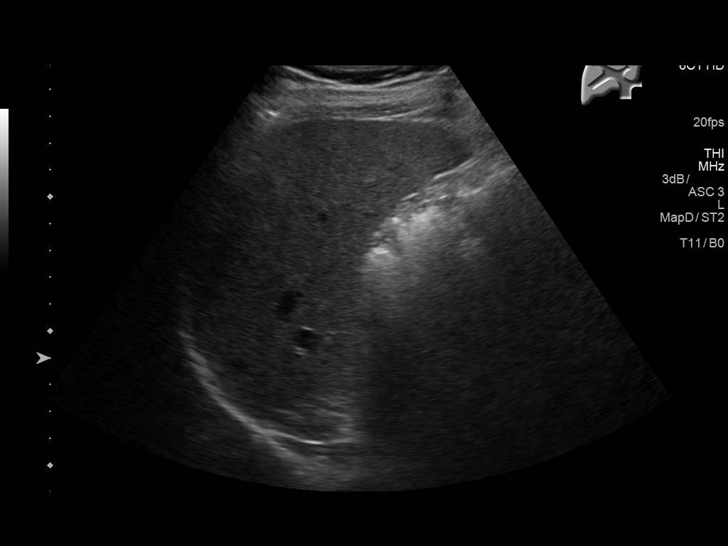
[im 37/50]
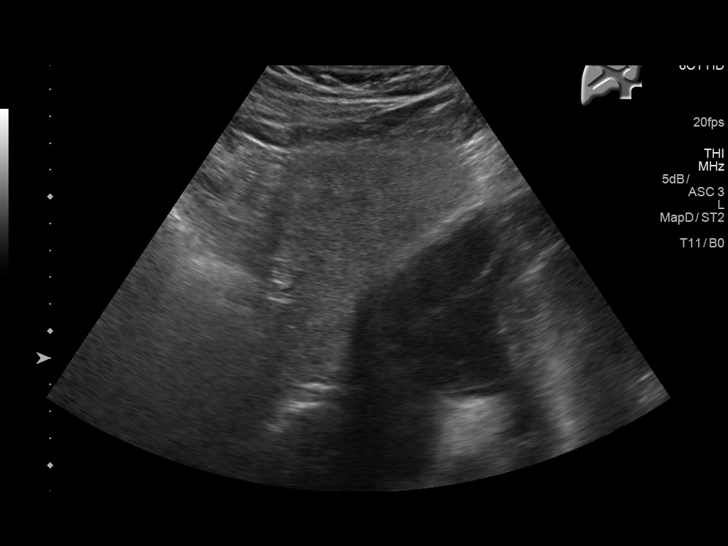
[im 41/50]
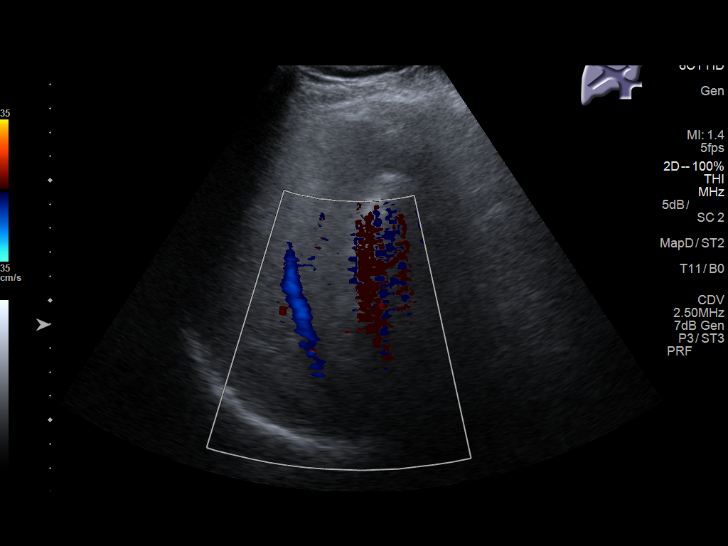
[im 45/50]
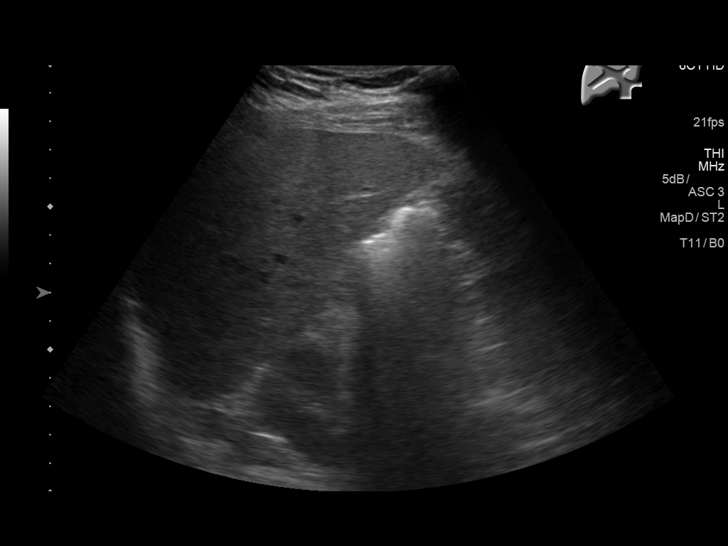
[im 50/50]
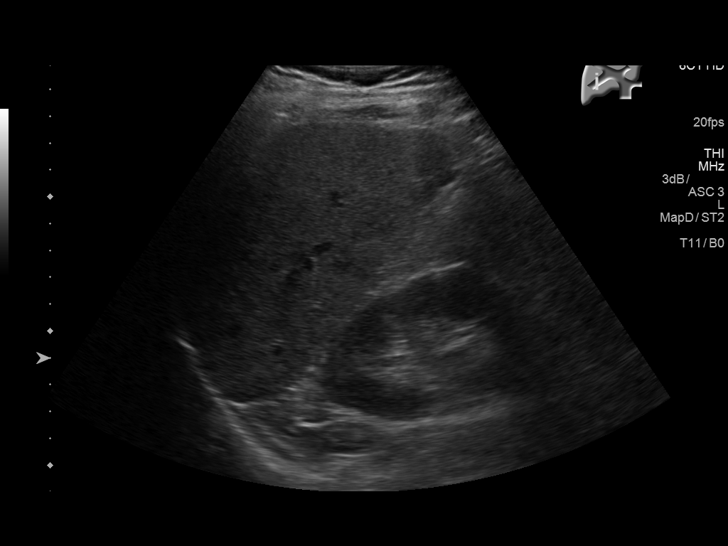

[14 of 25 positions shown; findings below may reference images not displayed]

FINDINGS: Gallbladder:

No gallstones or wall thickening visualized. No sonographic Murphy
sign noted by sonographer.

Common bile duct:

Diameter: 3.4 mm which is within normal limits.

Liver:

No focal lesion identified. Within normal limits in parenchymal
echogenicity. Portal vein is patent on color Doppler imaging with
normal direction of blood flow towards the liver.
IMPRESSION: No abnormality seen in the right upper quadrant of the abdomen.
